# Patient Record
Sex: Female | Born: 1960
Health system: Southern US, Community
[De-identification: ages and names within clinical notes are randomized; demographics above are authoritative.]

## PROBLEM LIST (undated history)

## (undated) DIAGNOSIS — Z8481 Family history of carrier of genetic disease: Secondary | ICD-10-CM

## (undated) DIAGNOSIS — Z8042 Family history of malignant neoplasm of prostate: Secondary | ICD-10-CM

## (undated) DIAGNOSIS — Z803 Family history of malignant neoplasm of breast: Secondary | ICD-10-CM

## (undated) DIAGNOSIS — Z809 Family history of malignant neoplasm, unspecified: Secondary | ICD-10-CM

## (undated) DIAGNOSIS — D649 Anemia, unspecified: Secondary | ICD-10-CM

## (undated) DIAGNOSIS — D219 Benign neoplasm of connective and other soft tissue, unspecified: Secondary | ICD-10-CM

## (undated) HISTORY — DX: Family history of carrier of genetic disease: Z84.81

## (undated) HISTORY — DX: Anemia, unspecified: D64.9

## (undated) HISTORY — DX: Family history of malignant neoplasm of breast: Z80.3

## (undated) HISTORY — DX: Family history of malignant neoplasm, unspecified: Z80.9

## (undated) HISTORY — DX: Family history of malignant neoplasm of prostate: Z80.42

## (undated) HISTORY — DX: Benign neoplasm of connective and other soft tissue, unspecified: D21.9

---

## 1993-03-15 HISTORY — PX: TUBAL LIGATION: SHX77

## 2009-09-04 ENCOUNTER — Encounter: Admission: RE | Admit: 2009-09-04 | Discharge: 2009-09-04 | Payer: Self-pay

## 2010-02-27 ENCOUNTER — Ambulatory Visit: Payer: Self-pay | Admitting: Unknown Physician Specialty

## 2010-03-03 LAB — PATHOLOGY REPORT

## 2010-08-18 ENCOUNTER — Other Ambulatory Visit: Payer: Self-pay | Admitting: *Deleted

## 2010-08-18 DIAGNOSIS — Z1231 Encounter for screening mammogram for malignant neoplasm of breast: Secondary | ICD-10-CM

## 2010-08-27 ENCOUNTER — Ambulatory Visit
Admission: RE | Admit: 2010-08-27 | Discharge: 2010-08-27 | Disposition: A | Discharge status: Home / Self Care | Payer: BC Managed Care – PPO | Source: Ambulatory Visit | Attending: *Deleted | Admitting: *Deleted

## 2010-08-27 DIAGNOSIS — Z1231 Encounter for screening mammogram for malignant neoplasm of breast: Secondary | ICD-10-CM

## 2010-08-28 ENCOUNTER — Other Ambulatory Visit: Payer: Self-pay | Admitting: Family Medicine

## 2010-08-28 DIAGNOSIS — R928 Other abnormal and inconclusive findings on diagnostic imaging of breast: Secondary | ICD-10-CM

## 2010-09-03 ENCOUNTER — Ambulatory Visit
Admission: RE | Admit: 2010-09-03 | Discharge: 2010-09-03 | Disposition: A | Discharge status: Home / Self Care | Payer: BC Managed Care – PPO | Source: Ambulatory Visit | Attending: Family Medicine | Admitting: Family Medicine

## 2010-09-03 DIAGNOSIS — R928 Other abnormal and inconclusive findings on diagnostic imaging of breast: Secondary | ICD-10-CM

## 2011-04-13 ENCOUNTER — Other Ambulatory Visit: Payer: Self-pay | Admitting: Family Medicine

## 2011-04-13 DIAGNOSIS — Z1231 Encounter for screening mammogram for malignant neoplasm of breast: Secondary | ICD-10-CM

## 2011-06-10 ENCOUNTER — Ambulatory Visit
Admission: RE | Admit: 2011-06-10 | Discharge: 2011-06-10 | Disposition: A | Discharge status: Home / Self Care | Payer: BC Managed Care – PPO | Source: Ambulatory Visit | Attending: Family Medicine | Admitting: Family Medicine

## 2011-06-10 ENCOUNTER — Ambulatory Visit: Payer: BC Managed Care – PPO

## 2011-06-10 DIAGNOSIS — Z1231 Encounter for screening mammogram for malignant neoplasm of breast: Secondary | ICD-10-CM

## 2012-06-12 ENCOUNTER — Telehealth: Payer: Self-pay | Admitting: Obstetrics and Gynecology

## 2012-06-12 NOTE — Telephone Encounter (Signed)
LMTCB  aa 

## 2012-06-12 NOTE — Telephone Encounter (Signed)
Pt is having some menopausal concerns. Please call to advise.

## 2012-06-13 ENCOUNTER — Encounter: Payer: Self-pay | Admitting: Gynecology

## 2012-06-13 ENCOUNTER — Encounter: Payer: Self-pay | Admitting: Obstetrics and Gynecology

## 2012-06-13 ENCOUNTER — Ambulatory Visit (INDEPENDENT_AMBULATORY_CARE_PROVIDER_SITE_OTHER): Payer: 59 | Admitting: Gynecology

## 2012-06-13 VITALS — BP 130/68 | Resp 14 | Wt 164.0 lb

## 2012-06-13 DIAGNOSIS — N950 Postmenopausal bleeding: Secondary | ICD-10-CM

## 2012-06-13 NOTE — Telephone Encounter (Signed)
Spoke with pt who noticed some spotting on Friday, and continued spotting Sunday through today. Has not had a period for 2 yrs. Pt's PCP concerned. Sched OV today at 1245 with TL. aa

## 2012-06-13 NOTE — Addendum Note (Signed)
Addended by: Douglass Rivers on: 06/13/2012 02:04 PM   Modules accepted: Level of Service

## 2012-06-13 NOTE — Progress Notes (Signed)
  Endometrial Biopsy Procedure Note  Pt on HRT since August 2013, vivelle 0.1 and daily prometrium , started painless spotting a week ago, came in for evaluation, Pt overall very happy on HRT and if possible would like to continue.   Recommend EMB for evaluation, pt agreeable Pre-operative Diagnosis: postmenopausal bleeding on HRT  Post-operative Diagnosis: same  Indications: bleeding on postmenopausal hormone replacement  Procedure Details   Urine pregnancy test was not done.  The risks (including infection, bleeding, pain, and uterine perforation) and benefits of the procedure were explained to the patient and Verbal informed consent was obtained.  Antibiotic prophylaxis against endocarditis was not indicated.   The patient was placed in the dorsal lithotomy position.  Bimanual exam showed the uterus to be in the anteroflexed position.  A Graves' speculum inserted in the vagina, and the cervix prepped with povidone iodine. 2% Xylocaine jelly was placed on ecto and endocervix prior to betadine.   A sharp tenaculum was applied to the anterior lip of the cervix for stabilization.  A sterile Pipelle was used to sound the uterus to a depth of 7cm.  A Pipelle endometrial aspirator was used to sample the endometrium.  Sample was sent for pathologic examination.  Condition: Stable  Complications: None  Plan:  The patient was advised to call for any fever or for prolonged or severe pain or bleeding. She was advised to use OTC ibuprofen as needed for mild to moderate pain. She was advised to avoid vaginal intercourse for 48 hours or until the bleeding has completely stopped. Will  Notify regarding results and next step

## 2012-06-13 NOTE — Progress Notes (Deleted)
52 y.o. Unknown{Race/ethnicity:17218} female   [id number] here for annual exam.    No LMP recorded.          Sexually active: {yes no:314532}  The current method of family planning is {contraception:315051}.    Exercising: {yes no:314532}  {types:19826} Last mammogram: Last pap: Last BMD:  Alcohol: Tobacco:   Health Maintenance  Topic Date Due  . Influenza Vaccine  11/14/1959  . Pap Smear  02/05/1977  . Tetanus/tdap  02/05/1978  . Colonoscopy  02/05/2009  . Mammogram  06/09/2013    Family History  Problem Relation Age of Onset  . Ovarian cancer Sister     There is no problem list on file for this patient.   Past Medical History  Diagnosis Date  . Anemia     Past Surgical History  Procedure Laterality Date  . Tubal ligation  03/15/1993    Allergies: Naprosyn  Current Outpatient Prescriptions  Medication Sig Dispense Refill  . BLACK COHOSH PO Take by mouth.      . IRON PO Take by mouth.       No current facility-administered medications for this visit.    ROS: {Ros - complete:30496}  Exam:    There were no vitals taken for this visit. Weight change: @WEIGHTCHANGE @ Last 3 height recordings:  Ht Readings from Last 3 Encounters:  No data found for Ht   General appearance: {general exam:16600} Head: {head exam:30909::"Normocephalic, without obvious abnormality","atraumatic"} Neck: {neck exam:17463::"no adenopathy","no carotid bruit","no JVD","supple, symmetrical, trachea midline","thyroid not enlarged, symmetric, no tenderness/mass/nodules"} Lungs: {lung exam:16931} Breasts: {breast exam:13139::"normal appearance, no masses or tenderness"} Heart: {heart exam:5510} Abdomen: {abdominal exam:16834} Extremities: {extremity exam:5109} Skin: {skin exam:31329::"Skin color, texture, turgor normal. No rashes or lesions"} Lymph nodes: {lymph node exam:14039::"Cervical, supraclavicular, and axillary nodes normal."} {Exam; lymph nodes inguinal:30852} Neurologic: {neuro  exam:17854}   Pelvic: External genitalia:  {Exam; genitalia female:32129}              Urethra: {urethra:311719::"not indicated"}              Bartholins and Skenes: {EXAM; GYN AVWUJ:81191}                 Vagina: {vagina:315903::"normal appearing vagina with normal color and discharge, no lesions"}              Cervix: {exam; gyn cervix:30847}              Pap taken: {yes no:314532}        Bimanual Exam:  Uterus:  {uterus:315905::"uterus is normal size, shape, consistency and nontender"}                                      Adnexa:    {adnexa:311645::"not indicated"}                                      Rectovaginal: {Rectovaginal:16320}                                      Anus:  {Exam; anus:16940}  A: {Gyn assessment:5268::"well woman"}     P: {plan; gyn:5269::"mammogram","pap smear","return annually or prn"}      An After Visit Summary was printed and given to the patient.

## 2012-06-13 NOTE — Patient Instructions (Signed)
  Postmenopausal Bleeding Menopause is commonly referred to as the "change in life." It is a time when the fertile years, the time of ovulating and having menstrual periods, has come to an end. It is also determined by not having menstrual periods for 12 months.  Postmenopausal bleeding is any bleeding a woman has after she has entered into menopause. Any type of postmenopausal bleeding, even if it appears to be a typical menstrual period, is concerning. This should be evaluated by your caregiver.  CAUSES   Hormone therapy.  Cancer of the cervix or cancer of the lining of the uterus (endometrial cancer).  Thinning of the uterine lining (uterine atrophy).  Thyroid diseases.  Certain medicines.  Infection of the uterus or cervix.  Inflammation or irritation of the uterine lining (endometritis).  Estrogen-secreting tumors.  Growths (polyps) on the cervix, uterine lining, or uterus.  Uterine tumors (fibroids).  Being very overweight (obese). DIAGNOSIS  Your caregiver will take a medical history and ask questions. A physical exam will also be performed. Further tests may include:   A transvaginal ultrasound. An ultrasound wand or probe is inserted into your vagina to view the pelvic organs.  A biopsy of the lining of the uterus (endometrium). A sample of the endometrium is removed and examined.  A hysteroscopy. Your caregiver may use an instrument with a light and a camera attached to it (hysteroscope). The hysteroscope is used to look inside the uterus for problems.  A dilation and curettage (D&C). Tissue is removed from the uterine lining to be examined for problems. TREATMENT  Treatment depends on the cause of the bleeding. Some treatments include:   Surgery.  Medicines.  Hormones.  A hysteroscopy or D&C to remove polyps or fibroids.  Changing or stopping a current medicine you are taking. Talk to your caregiver about your specific treatment. HOME CARE INSTRUCTIONS    Maintain a healthy weight.  Keep regular pelvic exams and Pap tests. SEEK MEDICAL CARE IF:   You have bleeding, even if it is light in comparison to your previous periods.  Your bleeding lasts more than 1 week.  You have abdominal pain.  You develop bleeding with sexual intercourse. SEEK IMMEDIATE MEDICAL CARE IF:   You have a fever, chills, headache, dizziness, muscle aches, and bleeding.  You have severe pain with bleeding.  You are passing blood clots.  You have bleeding and need more than 1 pad an hour.  You feel faint. MAKE SURE YOU:  Understand these instructions.  Will watch your condition.  Will get help right away if you are not doing well or get worse. Document Released: 06/09/2005 Document Revised: 05/24/2011 Document Reviewed: 11/05/2010 [facility] Patient Information 2013 [place], [place]. Place endometrial biopsy patient instructions here.

## 2012-06-23 ENCOUNTER — Encounter: Payer: Self-pay | Admitting: *Deleted

## 2012-06-23 ENCOUNTER — Telehealth: Payer: Self-pay | Admitting: Gynecology

## 2012-06-23 NOTE — Telephone Encounter (Signed)
Informed pt regarding her recent EMB.  No atypia noted but scant cells therefore unable to evaluate for hyperplasia.  I have suggested that she return to office for sonohyterogram to rule out other pathology and possible repeat endometrial biopsy.  Pt reports stopping HRT after bleed, but she agrees to need for further evaluation.  She would like to discuss alternative therapies. Call transferred to [facility]

## 2012-06-23 NOTE — Telephone Encounter (Signed)
PATIENT CALLING FOR RESULTS OF ENDOMETRIAL BIOPSY DONE ON 06/13/2012. PLEASE ADVISE. Laura Dixon

## 2012-06-28 ENCOUNTER — Other Ambulatory Visit: Payer: Self-pay | Admitting: Gynecology

## 2012-06-28 ENCOUNTER — Other Ambulatory Visit: Payer: Self-pay | Admitting: *Deleted

## 2012-06-28 ENCOUNTER — Ambulatory Visit (INDEPENDENT_AMBULATORY_CARE_PROVIDER_SITE_OTHER): Payer: 59 | Admitting: Gynecology

## 2012-06-28 ENCOUNTER — Ambulatory Visit: Payer: 59

## 2012-06-28 ENCOUNTER — Encounter: Payer: Self-pay | Admitting: Gynecology

## 2012-06-28 DIAGNOSIS — N950 Postmenopausal bleeding: Secondary | ICD-10-CM

## 2012-06-28 DIAGNOSIS — D259 Leiomyoma of uterus, unspecified: Secondary | ICD-10-CM

## 2012-06-28 DIAGNOSIS — N951 Menopausal and female climacteric states: Secondary | ICD-10-CM

## 2012-06-28 MED ORDER — CONJ ESTROGENS-BAZEDOXIFENE 0.45-20 MG PO TABS: 0.4500 | ORAL_TABLET | ORAL | Status: DC

## 2012-06-28 NOTE — Progress Notes (Signed)
52 y.o.Unknownfemale here for a pelvic ultrasound with sonohystogram.  Indication: postmenopausal bleed with insufficient biopsy   Contraception: none  Technique:  Both transabdominal and transvaginal ultrasound  examinations of the pelvis were performed. Transabdominal technique  was performed for global imaging of the pelvis including uterus,  ovaries, adnexal regions, and pelvic cul-de-sac.  It was necessary to proceed with endovaginal exam following the abdominal ultrasound transabdominal exam to visualize the endometrium and adnexa.  Color and duplex Doppler ultrasound was utilized to evaluate blood  flow to the ovaries.       SHSG:  After obtaining appropriate verbal consent from patient, the cervix was visualized using a speculum, and prepped with betadine.  A tenaculum  was applied to the cervix.  Dilation of the cervix was necessary. The catheter was passed into the uterus and sterile saline introduced, with the following findings:  The endometrial cavity was notable for a submucosal fibroid, the lining was thin, no defects, EMS 2mm. The ultrasound and sonohysterogram findings were reviewed with the patient.  Based on the lining a repeat EMB was not done.  The patient had electively stopped HRT after the biopsy, she is now experiencing more hot flashes and night sweats that are disruptive to her activities of daily living.   The patient would like to discuss alternative treatments for her menopausal symptoms.  We reviewed her family history-14 children, no cardiac disease, 2 cancers- sister dx with ovarian alive and well after 20y We reviewed the risks and benefits of hormone replacement, including the findings of the [facility] and nurses study.  We discussed non-hormonal treatments as well as hormonal options.  After reviewing options extensively, the pt would like to try [facility], if it is too expensive, she will call for an rx  for effexor.   Questions were addressed. time >26m, >50%  face to face

## 2012-08-17 ENCOUNTER — Telehealth: Payer: Self-pay | Admitting: Gynecology

## 2012-08-17 NOTE — Telephone Encounter (Signed)
FYI only--Patient states Duavee and wasn't working. Is now back on patch Minivelle and progesterone tablet and reports she is doing fine with it in her second week of starting back on it.

## 2012-09-11 ENCOUNTER — Encounter: Payer: Self-pay | Admitting: Obstetrics and Gynecology

## 2012-09-11 ENCOUNTER — Ambulatory Visit: Payer: Self-pay | Admitting: Obstetrics and Gynecology

## 2012-09-11 ENCOUNTER — Ambulatory Visit (INDEPENDENT_AMBULATORY_CARE_PROVIDER_SITE_OTHER): Payer: 59 | Admitting: Obstetrics and Gynecology

## 2012-09-11 ENCOUNTER — Other Ambulatory Visit: Payer: Self-pay | Admitting: Obstetrics and Gynecology

## 2012-09-11 VITALS — BP 128/74 | HR 82 | Resp 14 | Ht 66.0 in | Wt 167.8 lb

## 2012-09-11 DIAGNOSIS — Z01419 Encounter for gynecological examination (general) (routine) without abnormal findings: Secondary | ICD-10-CM

## 2012-09-11 DIAGNOSIS — Z0000 Encounter for general adult medical examination without abnormal findings: Secondary | ICD-10-CM

## 2012-09-11 LAB — POCT URINALYSIS DIPSTICK: Spec Grav, UA: 1.015

## 2012-09-11 MED ORDER — PROGESTERONE MICRONIZED 100 MG PO CAPS
100.0000 mg | ORAL_CAPSULE | Freq: Every day | ORAL | Status: DC
Start: 2012-09-11 — End: 2013-05-08

## 2012-09-11 MED ORDER — ESTRADIOL 0.1 MG/24HR TD PTTW: 1.0000 | MEDICATED_PATCH | TRANSDERMAL | Status: DC

## 2012-09-11 NOTE — Progress Notes (Signed)
52 y.o.  Married    [place]   female   [phone]   here for annual exam.  Loves her HRT. No more hot flashes.  Tried to stay off it for a few days but hot flashes quickly returned.   Tried Duavee but didn't like it.  Pt thinks she may have sometimes taken her progesterone twice at night just because she would forget if she took it or not. Had spotting, saw Dr. Farrel Gobble, had PUS and SHSG which showed only small fibroids but a 2 mm EMS.    Now back on her HRT like she's supposed to take it for the last 3 weeks and no spotting.    Patient's last menstrual period was 07/14/2010.          Sexually active: yes  The current method of family planning is tubal ligation.    Exercising:  golfing Last mammogram:  05/2011 Last pap smear: 06/2011 at PCP - nl History of abnormal pap: no Smoking: no Alcohol: no Last colonoscopy: 01/2011 Last Bone Density:   Last tetanus shot: 2013 Last cholesterol check: 2013  Hgb: 12.0               Urine: negative   Family History  Problem Relation Age of Onset  . Ovarian cancer Sister   . Prostate cancer Brother 85    Patient Active Problem List   Diagnosis Date Noted  . Menopausal symptoms 06/28/2012  . Postmenopausal bleeding 06/13/2012    Past Medical History  Diagnosis Date  . Anemia   . Fibroid     Past Surgical History  Procedure Laterality Date  . Tubal ligation  03/15/1993    Allergies: Naprosyn  Current Outpatient Prescriptions  Medication Sig Dispense Refill  . estradiol (VIVELLE-DOT) 0.1 MG/24HR Place 1 patch onto the skin 2 (two) times a week.      . progesterone (PROMETRIUM) 100 MG capsule Take 100 mg by mouth daily.       No current facility-administered medications for this visit.    ROS: Pertinent items are noted in HPI.  Social Hx:  Married, two children, works as a Production designer, theatre/television/film  Exam:    BP 128/74  Pulse 82  Resp 14  Ht 5\' 6"  (1.676 m)  Wt 167 lb 12.8 oz (76.114 kg)  BMI 27.1 kg/m2  LMP 07/14/2010  Down 7 pounds from  last year; ht stable Wt Readings from Last 3 Encounters:  09/11/12 167 lb 12.8 oz (76.114 kg)  06/13/12 164 lb (74.39 kg)     Ht Readings from Last 3 Encounters:  09/11/12 5\' 6"  (1.676 m)    General appearance: alert, cooperative and appears stated age Head: Normocephalic, without obvious abnormality, atraumatic Neck: no adenopathy, supple, symmetrical, trachea midline and thyroid not enlarged, symmetric, no tenderness/mass/nodules Lungs: clear to auscultation bilaterally Breasts: Inspection negative, No nipple retraction or dimpling, No nipple discharge or bleeding, No axillary or supraclavicular adenopathy, Normal to palpation without dominant masses Heart: regular rate and rhythm Abdomen: soft, non-tender; bowel sounds normal; no masses,  no organomegaly Extremities: extremities normal, atraumatic, no cyanosis or edema Skin: Skin color, texture, turgor normal. No rashes or lesions Lymph nodes: Cervical, supraclavicular, and axillary nodes normal. No abnormal inguinal nodes palpated Neurologic: Grossly normal   Pelvic: External genitalia:  no lesions              Urethra:  normal appearing urethra with no masses, tenderness or lesions  Bartholins and Skenes: normal                 Vagina: normal appearing vagina with normal color and discharge, no lesions              Cervix: normal appearance              Pap taken: no        Bimanual Exam:  Uterus:  uterus is normal size, shape, consistency and nontender                                      Adnexa: normal adnexa in size, nontender and no masses                                      Rectovaginal: Confirms                                      Anus:  normal sphincter tone, no lesions  A: normal menopausal exam, on HRT     P: mammogram counseled on adequate intake of calcium and vitamin D, diet and exercise return annually or prn     An After Visit Summary was printed and given to the patient.

## 2012-09-11 NOTE — Patient Instructions (Addendum)

## 2012-09-20 ENCOUNTER — Telehealth: Payer: Self-pay | Admitting: Gynecology

## 2012-09-20 NOTE — Telephone Encounter (Signed)
Patient rescheduled 3 month appointment from 09/27/12 to 09/29/12 with Dr. Farrel Gobble due to a surgery conflict Dr. Farrel Gobble had. Patient just saw Dr. Tresa Res for aex 09/11/12 and is not sure she still needs 3 month recheck. Please advise if appointment not needed?

## 2012-09-21 NOTE — Telephone Encounter (Signed)
No need to keep the f/u appt

## 2012-09-27 ENCOUNTER — Ambulatory Visit: Payer: 59 | Admitting: Gynecology

## 2012-09-29 ENCOUNTER — Ambulatory Visit: Payer: 59 | Admitting: Gynecology

## 2012-12-26 ENCOUNTER — Encounter: Payer: Self-pay | Admitting: Gynecology

## 2013-01-18 ENCOUNTER — Other Ambulatory Visit: Payer: Self-pay

## 2013-02-05 ENCOUNTER — Ambulatory Visit: Payer: Self-pay

## 2013-02-05 LAB — URINALYSIS, COMPLETE
Bilirubin,UR: NEGATIVE
Glucose,UR: NEGATIVE mg/dL (ref 0–75)
Ketone: NEGATIVE
Leukocyte Esterase: NEGATIVE
Nitrite: NEGATIVE
Ph: 6 (ref 4.5–8.0)
Specific Gravity: >=1.03 (ref 1.003–1.030)

## 2013-02-06 ENCOUNTER — Telehealth: Payer: Self-pay | Admitting: Gynecology

## 2013-02-06 NOTE — Telephone Encounter (Signed)
Spoke with patient. She states she is feeling improved. Advised to continue medications as prescribed. Call us if develops, fever, flank pain or chills or vomiting.  TOC appointment scheduled.   Routing to provider for final review. Patient agreeable to disposition. Will close encounter

## 2013-02-06 NOTE — Telephone Encounter (Signed)
Patient went to see urgent care yesterday for bladder infection and they gave her these meds she wanted to make sure its ok.  nitrofurantroin  Phenazopyridine fluconazole  They told here that there was blood in her urine and that She also needed to schedule a follow up appt. For 2 weeks.

## 2013-02-07 LAB — URINE CULTURE

## 2013-02-19 ENCOUNTER — Ambulatory Visit (INDEPENDENT_AMBULATORY_CARE_PROVIDER_SITE_OTHER): Payer: 59 | Admitting: Gynecology

## 2013-02-19 ENCOUNTER — Other Ambulatory Visit: Payer: Self-pay

## 2013-02-19 VITALS — BP 118/78 | HR 62 | Resp 12 | Ht 66.0 in | Wt 171.0 lb

## 2013-02-19 DIAGNOSIS — M545 Low back pain, unspecified: Secondary | ICD-10-CM

## 2013-02-19 DIAGNOSIS — R319 Hematuria, unspecified: Secondary | ICD-10-CM

## 2013-02-19 LAB — POCT URINALYSIS DIPSTICK: Blood, UA: 2

## 2013-02-19 MED ORDER — CARISOPRODOL 350 MG PO TABS: 350.0000 mg | ORAL_TABLET | Freq: Four times a day (QID) | ORAL | Status: DC | PRN

## 2013-02-19 NOTE — Progress Notes (Signed)
Subjective:     Patient ID: Laura Dixon, female   DOB: 1960/05/20, 52 y.o.   MRN: [id number]  HPI Comments: Pt went to [facility] urgent care 2w ago for urinary frequency as precaution before Thanksgiving.  Pt was noted to have blood in urine but denies any pressure, burning or other symptoms.  Pt was treated with macrobid and pyridium.  Pt is using progestin and estrogen patch- pt had not missed any pills and is unaware of vaginal bleeding.  Pt dose drink 1-2 soda/d, no coffee or tea.   Pt also reports low back pain not related to the urine, initial injury approx 10y ago. Had used motrin with little affect    Review of Systems  Constitutional: Negative for fever and chills.  Genitourinary: Positive for frequency and hematuria. Negative for urgency, vaginal bleeding, vaginal discharge and vaginal pain.       Objective:   Physical Exam  Constitutional: She is oriented to person, place, and time. She appears well-developed and well-nourished.  Abdominal: There is no CVA tenderness.  Genitourinary: Vagina normal. There is no rash or tenderness on the right labia. There is no rash or tenderness on the left labia. Uterus is enlarged. Uterus is not tender. Cervix exhibits no motion tenderness and no discharge. Right adnexum displays no mass. Left adnexum displays no mass.  No tenderness under bladder  Lymphadenopathy:       Right: No inguinal adenopathy present.       Left: No inguinal adenopathy present.  Neurological: She is alert and oriented to person, place, and time.       Assessment:     hemturia-asymptomatic Low back pain     Plan:     Urine culture-if negative, will refer to urology-differential discussed with pt.  Since no symptoms, no rx given. rx soma TID prn back pain-driving precautions given

## 2013-02-20 LAB — URINALYSIS, MICROSCOPIC ONLY
Bacteria, UA: NONE SEEN
Casts: NONE SEEN

## 2013-02-21 LAB — URINE CULTURE: Colony Count: >=100000

## 2013-02-23 ENCOUNTER — Telehealth: Payer: Self-pay | Admitting: Gynecology

## 2013-02-23 MED ORDER — FLUCONAZOLE 150 MG PO TABS: 150.0000 mg | ORAL_TABLET | Freq: Once | ORAL | Status: DC

## 2013-02-23 MED ORDER — CEPHALEXIN 500 MG PO CAPS: 500.0000 mg | ORAL_CAPSULE | Freq: Four times a day (QID) | ORAL | Status: DC

## 2013-02-23 NOTE — Addendum Note (Signed)
Addended by: Lorraine Lax on: 02/23/2013 02:49 PM   Modules accepted: Orders

## 2013-02-23 NOTE — Telephone Encounter (Signed)
Pt is currently on Cephalexin 500 mg 1 pill 4 times a day. Pt is questioning if she is suppose to take that much each day.

## 2013-02-26 ENCOUNTER — Other Ambulatory Visit: Payer: Self-pay

## 2013-02-26 DIAGNOSIS — Z1231 Encounter for screening mammogram for malignant neoplasm of breast: Secondary | ICD-10-CM

## 2013-02-26 NOTE — Telephone Encounter (Signed)
Order per Dr. Farrel Gobble:  Notes Recorded by Bennye Alm, MD on 02/23/2013 at 11:53 AM Inform that the urine culture was lactobacilli with is a vaginal bateria and not considered a pathogen but if she is having symptoms, i would try keflex 500.g QID #28 and do a test of cure   Message left to return call to [place] at [phone].

## 2013-02-26 NOTE — Telephone Encounter (Signed)
Spoke with patient and went over instructions for taking her medication.   States she feels improved but that she is sometimes noticing spotting after urinating since Saturday. Dr. Farrel Gobble, do you need to see patient again since she is having spotting? She states she will call back if continues? Has appointment for [facility] 03/20/13.

## 2013-02-26 NOTE — Telephone Encounter (Signed)
The patient was unable to tell me where she thought she may be bleeding from. Her appointment is an OV with Dr. Farrel Gobble at 03/30/13 at 2:00 patient just rescheduled appointment with front desk.

## 2013-02-26 NOTE — Telephone Encounter (Signed)
Is the bleeding vaginal or urine, she will need ov with toc and not just lab visit

## 2013-02-26 NOTE — Telephone Encounter (Signed)
Spoke with patient. She feels she has something respiratory related. Was around family who may have been sick this weekend. She wanted to know if she should finish abx as directed by Dr. Farrel Gobble. I advised, yes continue until done. These antibiotics will usually not cover anything respiratory so if worsens, please call her pcp. Patient is agreeable and will monitor symptoms.

## 2013-02-26 NOTE — Telephone Encounter (Signed)
Patient states she isnt feeling well and wasn't sure if she need to continue the antibiotics dr lathrop gave her. She said she was gonna go see her pcp if she didn't feel better tomorrow

## 2013-03-30 ENCOUNTER — Ambulatory Visit: Payer: 59 | Admitting: Gynecology

## 2013-03-30 ENCOUNTER — Ambulatory Visit: Payer: BC Managed Care – PPO

## 2013-04-10 ENCOUNTER — Ambulatory Visit
Admission: RE | Admit: 2013-04-10 | Discharge: 2013-04-10 | Disposition: A | Discharge status: Home / Self Care | Payer: 59 | Source: Ambulatory Visit

## 2013-04-10 ENCOUNTER — Ambulatory Visit (INDEPENDENT_AMBULATORY_CARE_PROVIDER_SITE_OTHER): Payer: 59 | Admitting: Gynecology

## 2013-04-10 ENCOUNTER — Other Ambulatory Visit: Payer: Self-pay

## 2013-04-10 ENCOUNTER — Encounter: Payer: Self-pay | Admitting: Gynecology

## 2013-04-10 VITALS — BP 126/78 | HR 80 | Resp 18 | Ht 66.0 in | Wt 172.0 lb

## 2013-04-10 DIAGNOSIS — Z1231 Encounter for screening mammogram for malignant neoplasm of breast: Secondary | ICD-10-CM

## 2013-04-10 DIAGNOSIS — N390 Urinary tract infection, site not specified: Secondary | ICD-10-CM

## 2013-04-10 DIAGNOSIS — M545 Low back pain, unspecified: Secondary | ICD-10-CM

## 2013-04-10 DIAGNOSIS — D219 Benign neoplasm of connective and other soft tissue, unspecified: Secondary | ICD-10-CM

## 2013-04-10 DIAGNOSIS — D259 Leiomyoma of uterus, unspecified: Secondary | ICD-10-CM

## 2013-04-10 MED ORDER — CARISOPRODOL 350 MG PO TABS: 350.0000 mg | ORAL_TABLET | Freq: Four times a day (QID) | ORAL | Status: DC | PRN

## 2013-04-10 NOTE — Progress Notes (Signed)
Subjective:     Patient ID: Laura Dixon, female   DOB: 12-12-1960, 53 y.o.   MRN: [id number]  HPI Comments: Pt is here for test of cure for UTI with lactobacilli, she states that she is feeling better regarding her urgency.  She does state that she did not end up picking up her soma as it was not ready when she went to the pharmacy instead she had been taking NSAIDs but has not had great results.  Pt reports pain in her right hip, behind her right knee and to she right shin.  She denies change in either exercise or shoes.  She is unaware if she has any swelling but she states that she feels like she gets compression support from her boots.  Pt is taking both estrogen and progestin.  No prior history of blood clots.    Review of Systems Per HPI    Objective:   Physical Exam  Constitutional: She is oriented to person, place, and time. She appears well-developed and well-nourished.  Musculoskeletal:       Right lower leg: She exhibits no tenderness, no bony tenderness, no swelling and no edema (calf measured 80mm below top of knee, 85mm, neg homan's sign).       Left lower leg: She exhibits no tenderness, no bony tenderness (measured 15cm below top of knee, 35cm), no swelling and no edema.       Legs: Neurological: She is alert and oriented to person, place, and time.  Skin: Skin is warm and dry.       Assessment:     Right hip/leg pain on estrogen Fibroid uterus     Plan:     No evidence of DVT, we reviewed her u/s images, posterior fibroid noted 2.5cm but not pedunculated, may be etiology of pain but expect more musculoskeletal, we refilled her soma and we ask her to f/u with pcp and let us know how she is doing, may benefit from PT

## 2013-04-11 LAB — URINE CULTURE
Colony Count: NO GROWTH
ORGANISM ID, BACTERIA: NO GROWTH

## 2013-04-16 ENCOUNTER — Ambulatory Visit: Payer: BC Managed Care – PPO

## 2013-05-08 ENCOUNTER — Other Ambulatory Visit: Payer: Self-pay | Admitting: *Deleted

## 2013-05-08 MED ORDER — ESTRADIOL 0.1 MG/24HR TD PTTW: 1.0000 | MEDICATED_PATCH | TRANSDERMAL | Status: DC

## 2013-05-08 MED ORDER — PROGESTERONE MICRONIZED 100 MG PO CAPS: 100.0000 mg | ORAL_CAPSULE | Freq: Every day | ORAL | Status: DC

## 2013-05-08 NOTE — Telephone Encounter (Signed)
Incoming fax requesting [place] and Progesterone. Last AEX and refill 09/11/2012 x 1 year, sent to [facility] Next appt 09/21/2013  - Called patient and she is going to start using [facility].  - Rx sent enough until 09/2013 - Pt notified.

## 2013-09-20 ENCOUNTER — Ambulatory Visit: Payer: 59 | Admitting: Gynecology

## 2013-09-21 ENCOUNTER — Ambulatory Visit: Payer: 59 | Admitting: Gynecology

## 2013-10-01 ENCOUNTER — Other Ambulatory Visit: Payer: Self-pay

## 2013-10-01 NOTE — Telephone Encounter (Signed)
Last AEX: 09/11/12 Last refill:05/08/13 #24, 1 refill Last MMG: 04/13/13 BI-RADS-neg Current AEX: pt has canceled 2 AEX appt.  Please approve or deny

## 2013-11-23 ENCOUNTER — Other Ambulatory Visit: Payer: Self-pay

## 2013-11-23 NOTE — Telephone Encounter (Signed)
Last AEX: 09/11/12 Last refill: 05/08/13 #90 X 1 Current AEX: NS Last MMG: 04/13/13 Bi-Rads neg  Please advise

## 2013-11-27 MED ORDER — PROGESTERONE MICRONIZED 100 MG PO CAPS: 100.0000 mg | ORAL_CAPSULE | Freq: Every day | ORAL | Status: DC

## 2013-11-27 NOTE — Addendum Note (Signed)
Addended by: Gerda Diss on: 11/27/2013 02:02 PM   Modules accepted: Orders

## 2013-11-27 NOTE — Telephone Encounter (Signed)
Sent to wrong pharmacy.  Resent rx to [facility]

## 2013-11-27 NOTE — Telephone Encounter (Signed)
Overdue for annual

## 2013-11-27 NOTE — Telephone Encounter (Signed)
S/w pt. She will call back tomorrow to schedule her AEX due to her driving. Encounter closed

## 2013-11-29 MED ORDER — PROGESTERONE MICRONIZED 100 MG PO CAPS: 100.0000 mg | ORAL_CAPSULE | Freq: Every day | ORAL | Status: DC

## 2013-11-29 MED ORDER — ESTRADIOL 0.1 MG/24HR TD PTTW: 1.0000 | MEDICATED_PATCH | TRANSDERMAL | Status: DC

## 2013-11-29 NOTE — Addendum Note (Signed)
Addended by: Gerda Diss on: 11/29/2013 08:53 AM   Modules accepted: Orders

## 2013-11-29 NOTE — Telephone Encounter (Signed)
S/W pt and she stated the pharmacy had not received her rx. Resent to pharmacy  Sent 30 day supply until pt has her AEX

## 2013-12-12 ENCOUNTER — Ambulatory Visit: Payer: 59 | Admitting: Gynecology

## 2013-12-12 ENCOUNTER — Encounter: Payer: Self-pay | Admitting: Gynecology

## 2013-12-12 ENCOUNTER — Ambulatory Visit (INDEPENDENT_AMBULATORY_CARE_PROVIDER_SITE_OTHER): Payer: BC Managed Care – PPO | Admitting: Gynecology

## 2013-12-12 VITALS — BP 120/60 | HR 70 | Resp 16 | Ht 66.0 in | Wt 166.0 lb

## 2013-12-12 DIAGNOSIS — Z124 Encounter for screening for malignant neoplasm of cervix: Secondary | ICD-10-CM

## 2013-12-12 DIAGNOSIS — Z01419 Encounter for gynecological examination (general) (routine) without abnormal findings: Secondary | ICD-10-CM

## 2013-12-12 DIAGNOSIS — N951 Menopausal and female climacteric states: Secondary | ICD-10-CM

## 2013-12-12 DIAGNOSIS — Z0000 Encounter for general adult medical examination without abnormal findings: Secondary | ICD-10-CM

## 2013-12-12 LAB — POCT URINALYSIS DIPSTICK
LEUKOCYTES UA: NEGATIVE
PH UA: 5
Urobilinogen, UA: NEGATIVE

## 2013-12-12 LAB — HEMOGLOBIN, FINGERSTICK: Hemoglobin, fingerstick: 12.4 g/dL (ref 12.0–16.0)

## 2013-12-12 MED ORDER — PROGESTERONE MICRONIZED 100 MG PO CAPS: 100.0000 mg | ORAL_CAPSULE | Freq: Every day | ORAL | Status: DC

## 2013-12-12 MED ORDER — ESTRADIOL 0.1 MG/24HR TD PTTW: 1.0000 | MEDICATED_PATCH | TRANSDERMAL | Status: DC

## 2013-12-12 NOTE — Progress Notes (Addendum)
53 y.o. Married [place] American female   [phone] here for annual exam. Pt reports menses are absent due to Menopause. She does not report hot flashes, does not have night sweats, does not have vaginal dryness.  She is not using lubricants.  She does report post-menopasual bleeding.  Very happy with HRT regiment, no bleeding  Patient's last menstrual period was 07/14/2010.          Sexually active: Yes.    The current method of family planning is post menopausal status.    Exercising: No.  The patient does not participate in regular exercise at present. Last pap: 06/2011- WNL Abnormal PAP: no Mammogram: 04/13/13 Bi-Rads 1 BSE: no  Colonoscopy:  2011- Normal f/u in 10 years  DEXA: never had one Alcohol: no Tobacco: no  Hgb: 12.4  ;  Urine: Trace    Health Maintenance  Topic Date Due  . Colonoscopy  02/05/2009  . Influenza Vaccine  10/13/2013  . Mammogram  04/11/2015  . Pap Smear  09/12/2015  . Tetanus/tdap  03/15/2021    Family History  Problem Relation Age of Onset  . Ovarian cancer Sister   . Prostate cancer Brother 69    Patient Active Problem List   Diagnosis Date Noted  . Menopausal symptoms 06/28/2012  . Postmenopausal bleeding 06/13/2012    Past Medical History  Diagnosis Date  . Anemia   . Fibroid     Past Surgical History  Procedure Laterality Date  . Tubal ligation  03/15/1993    Allergies: Naprosyn  Current Outpatient Prescriptions  Medication Sig Dispense Refill  . carisoprodol (SOMA) 350 MG tablet Take 1 tablet (350 mg total) by mouth 4 (four) times daily as needed for muscle spasms.  30 tablet  0  . estradiol (VIVELLE-DOT) 0.1 MG/24HR patch Place 1 patch (0.1 mg total) onto the skin 2 (two) times a week.  8 patch  0  . meloxicam (MOBIC) 15 MG tablet       . progesterone (PROMETRIUM) 100 MG capsule Take 1 capsule (100 mg total) by mouth daily.  30 capsule  0   No current facility-administered medications for this visit.    ROS: Pertinent items  are noted in HPI.  Exam:    LMP 07/14/2010 Weight change: @WEIGHTCHANGE @ Last 3 height recordings:  Ht Readings from Last 3 Encounters:  04/10/13 5\' 6"  (1.676 m)  02/19/13 5\' 6"  (1.676 m)  09/11/12 5\' 6"  (1.676 m)   General appearance: alert, cooperative and appears stated age Head: Normocephalic, without obvious abnormality, atraumatic Neck: no adenopathy, no carotid bruit, no JVD, supple, symmetrical, trachea midline and thyroid not enlarged, symmetric, no tenderness/mass/nodules Lungs: clear to auscultation bilaterally Breasts: Inspection negative, No nipple retraction or dimpling, No nipple discharge or bleeding, No axillary or supraclavicular adenopathy, Normal to palpation without dominant masses Heart: regular rate and rhythm, S1, S2 normal, no murmur, click, rub or gallop Abdomen: soft, non-tender; bowel sounds normal; no masses,  no organomegaly Extremities: extremities normal, atraumatic, no cyanosis or edema Skin: Skin color, texture, turgor normal. No rashes or lesions Lymph nodes: Cervical, supraclavicular, and axillary nodes normal. no inguinal nodes palpated Neurologic: Grossly normal   Pelvic: External genitalia:  no lesions              Urethra: normal appearing urethra with no masses, tenderness or lesions              Bartholins and Skenes: Bartholin's, Urethra, Skene's normal  Vagina: normal appearing vagina with normal color and discharge, no lesions              Cervix: normal appearance              Pap taken: Yes.          Bimanual Exam:  Uterus:  uterus is normal size, shape, consistency and nontender                                      Adnexa:    normal adnexa in size, nontender and no masses                                      Rectovaginal: Confirms                                      Anus:  normal sphincter tone, no lesions         1. Routine gynecological examination  mammogram pap smear done, no record of 2013 PAP-pt  agreeabe counseled on breast self exam, mammography screening, menopause, adequate intake of calcium and vitamin D, diet and exercise return annually or prn Discussed PAP guideline changes, importance of weight bearing exercises, calcium, vit D and balanced diet. 2. Laboratory examination ordered as part of a routine general medical examination  - POCT Urinalysis Dipstick - Hemoglobin, fingerstick  3. Menopausal symptom Doing well on current regimen, wishes to continue, pt will need 2nd rx sent to mail order-does not have info today - progesterone (PROMETRIUM) 100 MG capsule; Take 1 capsule (100 mg total) by mouth daily.  Dispense: 30 capsule; Refill: 0 - estradiol (VIVELLE-DOT) 0.1 MG/24HR patch; Place 1 patch (0.1 mg total) onto the skin 2 (two) times a week.  Dispense: 8 patch; Refill: 0  An After Visit Summary was printed and given to the patient.

## 2013-12-14 ENCOUNTER — Telehealth: Payer: Self-pay | Admitting: Gynecology

## 2013-12-14 NOTE — Addendum Note (Signed)
Addended by: Elveria Rising on: 12/14/2013 04:13 PM   Modules accepted: Orders

## 2013-12-14 NOTE — Telephone Encounter (Signed)
Left message to call Kaitlyn at [phone]. 

## 2013-12-14 NOTE — Telephone Encounter (Signed)
Pt called during lunch stating that she wanted to share something with the nurse.

## 2013-12-18 LAB — IPS PAP TEST WITH HPV

## 2013-12-18 NOTE — Telephone Encounter (Signed)
Left message to call Kaitlyn at [phone]. 

## 2013-12-21 NOTE — Telephone Encounter (Signed)
Spoke with patient. Patient states that she was seen at urgent care and had x-rays done and was placed on a steroid. Patient states that she was having "symptoms" while on the steroid but spoke with urgent care and everything has cleared up. "i got it taken care of."  Advised patient if she needs anything to give [place] a call. Patient is agreeable.  Routing to provider for final review. Patient agreeable to disposition. Will close encounter

## 2013-12-24 ENCOUNTER — Ambulatory Visit: Payer: 59 | Admitting: Gynecology

## 2013-12-25 ENCOUNTER — Ambulatory Visit: Payer: 59 | Admitting: Gynecology

## 2014-01-14 ENCOUNTER — Encounter: Payer: Self-pay | Admitting: Gynecology

## 2014-01-28 ENCOUNTER — Other Ambulatory Visit: Payer: Self-pay

## 2014-01-28 DIAGNOSIS — N951 Menopausal and female climacteric states: Secondary | ICD-10-CM

## 2014-01-28 NOTE — Telephone Encounter (Addendum)
Incoming Refill Request from [facility] VY:[id number] 0.1 mg, progesterone 100mg  Last AEX:12/12/13 Last Refill:12/12/13 #8 X 0 Next AEX:NS Last MMG:04/10/13 Bi-Rads Neg   Per 12/12/13 Office visit note, pt wants refills to go to a home delivery. Pt was suppose to let us know the name.   Please Advise

## 2014-01-28 NOTE — Telephone Encounter (Signed)
LMTCB concerning refill request.

## 2014-01-29 ENCOUNTER — Other Ambulatory Visit: Payer: Self-pay | Admitting: Gynecology

## 2014-01-29 NOTE — Telephone Encounter (Signed)
Patient left a message 01/28/14 @ 5:40pm on answering machine returning a call to [facility]. (refill)

## 2014-01-29 NOTE — Telephone Encounter (Signed)
S/W pt. Pt want her rx to still go to local pharmacy. Stated to pt will refill until her MMG is due. Pt voiced understanding and agreed.   Ok to send this?

## 2014-01-30 MED ORDER — ESTRADIOL 0.1 MG/24HR TD PTTW: 1.0000 | MEDICATED_PATCH | TRANSDERMAL | Status: DC

## 2014-01-30 MED ORDER — PROGESTERONE MICRONIZED 100 MG PO CAPS: 100.0000 mg | ORAL_CAPSULE | Freq: Every day | ORAL | Status: DC

## 2014-01-30 NOTE — Telephone Encounter (Signed)
Laura Dixon sent 1 mth supply. LMOM on pt phone rx has been sent

## 2014-01-30 NOTE — Telephone Encounter (Addendum)
Pt checking on refill request. Please call her at work [phone].

## 2014-01-30 NOTE — Telephone Encounter (Signed)
LMOM informing rx has been sent

## 2014-02-04 ENCOUNTER — Telehealth: Payer: Self-pay | Admitting: Gynecology

## 2014-02-04 NOTE — Telephone Encounter (Signed)
Patient is asking to talk to [place] regarding her bill.

## 2014-02-27 ENCOUNTER — Other Ambulatory Visit: Payer: Self-pay | Admitting: Obstetrics & Gynecology

## 2014-02-27 DIAGNOSIS — N951 Menopausal and female climacteric states: Secondary | ICD-10-CM

## 2014-02-27 MED ORDER — PROGESTERONE MICRONIZED 100 MG PO CAPS: 100.0000 mg | ORAL_CAPSULE | Freq: Every day | ORAL | Status: DC

## 2014-02-27 NOTE — Telephone Encounter (Signed)
Rx done as well as Prometrium 100mg  daily as she will need this as well and it was written without additional refills.

## 2014-02-27 NOTE — Telephone Encounter (Signed)
Medication refill request: Vivelle Dot patch Last AEX: 12/12/13 Next AEX: not scheduled.  Last MMG (if hormonal medication request): 04/13/13 BIRADS1:neg Refill authorized: 01/30/14 #8patch/0 refills

## 2014-02-28 ENCOUNTER — Telehealth: Payer: Self-pay | Admitting: Gynecology

## 2014-02-28 NOTE — Telephone Encounter (Signed)
Patient requesting RX refills for: progesterone and vivelle dot be sent to her pharmacy on file in one month increments.

## 2014-02-28 NOTE — Telephone Encounter (Signed)
02/27/14 Vivelle Dot 0.1 mg #8/10 rfs and Progesterone 100 mg #30/10 rfs was sent to [facility], LM on patient's vm that rx has been sent.

## 2014-02-28 NOTE — Telephone Encounter (Signed)
Routed to provider for review, encounter closed.  

## 2014-03-12 ENCOUNTER — Telehealth: Payer: Self-pay | Admitting: Gynecology

## 2014-03-12 NOTE — Telephone Encounter (Signed)
Patient has a question regarding her recent statement.

## 2014-08-05 ENCOUNTER — Other Ambulatory Visit: Payer: Self-pay | Admitting: *Deleted

## 2014-08-05 DIAGNOSIS — N951 Menopausal and female climacteric states: Secondary | ICD-10-CM

## 2014-08-05 NOTE — Telephone Encounter (Signed)
Medication refill request from [facility] requesting 90 day supply of Progesterone 100 mg  Last AEX:  12/12/13 with TL Next AEX: No AEX scheduled for 2016 Last MMG (if hormonal medication request): 04/13/13 bi-rads 1: negative Refill authorized: Please advise.

## 2014-08-06 NOTE — Telephone Encounter (Signed)
I will RF for #30 days at a local pharmacy but please inform pt she needs MMG before any additional HRT refills will be done.  Also, she does need AEX scheduled.  RF declined for now.

## 2014-08-06 NOTE — Telephone Encounter (Signed)
Left Message To Call Back  

## 2014-08-09 ENCOUNTER — Other Ambulatory Visit: Payer: Self-pay

## 2014-08-09 DIAGNOSIS — Z1231 Encounter for screening mammogram for malignant neoplasm of breast: Secondary | ICD-10-CM

## 2014-08-09 NOTE — Telephone Encounter (Signed)
Called patient she is aware that she needs to schedule mmg before any further refills. Patient says she will schedule her mmg within the next couple of weeks. Patient will give [place] a call when this is done and to schedule AEX with Dr. Sabra Heck.

## 2014-08-16 ENCOUNTER — Other Ambulatory Visit: Payer: Self-pay

## 2014-08-16 ENCOUNTER — Telehealth: Payer: Self-pay | Admitting: Obstetrics & Gynecology

## 2014-08-16 ENCOUNTER — Ambulatory Visit
Admission: RE | Admit: 2014-08-16 | Discharge: 2014-08-16 | Disposition: A | Discharge status: Home / Self Care | Payer: BLUE CROSS/BLUE SHIELD | Source: Ambulatory Visit

## 2014-08-16 DIAGNOSIS — Z1231 Encounter for screening mammogram for malignant neoplasm of breast: Secondary | ICD-10-CM

## 2014-08-16 DIAGNOSIS — N951 Menopausal and female climacteric states: Secondary | ICD-10-CM

## 2014-08-16 MED ORDER — PROGESTERONE MICRONIZED 100 MG PO CAPS: 100.0000 mg | ORAL_CAPSULE | Freq: Every day | ORAL | Status: DC

## 2014-08-16 MED ORDER — ESTRADIOL 0.1 MG/24HR TD PTTW: MEDICATED_PATCH | TRANSDERMAL | Status: DC

## 2014-08-16 NOTE — Telephone Encounter (Signed)
Left detailed message at number provided [phone]. Advised I see that patient mammogram was performed [facility]. Will need to wait for results for review by Dr.Miller before refills of hormones can be given. Advised to return call to office with any further questions.

## 2014-08-16 NOTE — Telephone Encounter (Signed)
Pt states she was instructed to call back after she got her mammorgam completed so that she may get a refill. Pt states she is a prior patient of Dr. Charlies Constable but listed Dr. Sabra Heck on her mammogram results.

## 2014-08-19 ENCOUNTER — Ambulatory Visit: Payer: Self-pay

## 2014-10-03 ENCOUNTER — Other Ambulatory Visit: Payer: Self-pay | Admitting: Obstetrics & Gynecology

## 2014-10-04 NOTE — Telephone Encounter (Signed)
Tried to call and s/w patient, got her vm. Left message that we received rx request from pharmacy and we are waiting  to hear back from the provider.

## 2014-10-04 NOTE — Telephone Encounter (Signed)
Medication refill request: Estradiol 0.1 mg patch  Last AEX:  12/12/13 with TL Next AEX: No AEX scheduled for 2016 Last MMG (if hormonal medication request): 08/16/14 bi-rads 1: negative Refill authorized: #8/2 rfs, please advise.

## 2014-10-04 NOTE — Telephone Encounter (Signed)
Patient calling to check on the status of this request. She is needing to change her patch on Sunday. She has some questions about what to do if she cannot get the refill by Sunday. Additionally, patient is traveling to [place] today and will not be available between 9:30 and 4:30. She says it is okay to leave a message.

## 2014-10-04 NOTE — Telephone Encounter (Signed)
Left message on patient's vm that rx has been sent in to pharmacy.

## 2014-10-10 ENCOUNTER — Telehealth: Payer: Self-pay | Admitting: *Deleted

## 2014-10-10 NOTE — Telephone Encounter (Signed)
OK to place a new patch to clean and dry skin now. Let me know if she is having problems with this patch sticking.  Due for annual exam in the fall.   Thanks!

## 2014-10-10 NOTE — Telephone Encounter (Signed)
Left Message To Call Back  

## 2014-10-10 NOTE — Telephone Encounter (Addendum)
Patient appreciative of [place] sending in her prescription last week.  She is using the Vivelle Dot 0.1 mg and taking Progesterone 100 mg. Patient only takes the progesterone at night when she puts on patch.  Patient said she went a couple days without the patch due to her being out of town. She typically puts her patches on Sunday morning and Wednesday afternoons. Patient said she went yesterday to her pharmacy and picked up patch and put it on Tuesday afternoon after she showered. She says it wasn't sticking all that well. Patient has been having light bleeding since Sunday. Patient wants to know how she should proceed in regards to her patch.Should she wait until Sunday to put another one on or go without it for this week and just have a period?   Please advise [name] with patient's next step. (Patient said it's okay to leave detailed message).

## 2014-10-10 NOTE — Telephone Encounter (Signed)
Left detailed message as seen below and for patient to give [place] a call back if she has any issues.  Thanks Dr. Quincy Simmonds!

## 2014-10-10 NOTE — Telephone Encounter (Signed)
Thank you. Encounter closed. 

## 2014-10-15 ENCOUNTER — Other Ambulatory Visit: Payer: Self-pay | Admitting: Obstetrics & Gynecology

## 2014-10-16 MED ORDER — ESTRADIOL 0.1 MG/24HR TD PTTW: MEDICATED_PATCH | TRANSDERMAL | Status: DC

## 2014-10-16 NOTE — Telephone Encounter (Signed)
Patient scheduled her aex 12/27/14 with Dr.Silva.

## 2014-10-16 NOTE — Telephone Encounter (Addendum)
Patient scheduled AEX for 12/27/14 with Dr. Quincy Simmonds, okay to send rfs in to last patient until October? Patient will also run out of Vivelle Dot Patch before then as well.

## 2014-10-16 NOTE — Telephone Encounter (Signed)
Medication refill request: Progesterone 100 mg  Last AEX:  12/12/13 with TL Next AEX: No AEX scheduled  Last MMG (if hormonal medication request): 08/16/14 bi-rads 1: negative Refill authorized: #30/1 rfs

## 2014-10-16 NOTE — Telephone Encounter (Signed)
Patient calling back to to ask if her refills can be extended until her appointment in October.

## 2014-10-18 NOTE — Telephone Encounter (Signed)
Patient notified that rx has been sent into pharmacy

## 2014-11-04 ENCOUNTER — Telehealth: Payer: Self-pay | Admitting: Obstetrics and Gynecology

## 2014-11-04 DIAGNOSIS — N950 Postmenopausal bleeding: Secondary | ICD-10-CM

## 2014-11-04 NOTE — Telephone Encounter (Signed)
Left message to call Kaitlyn at [phone]. 

## 2014-11-04 NOTE — Telephone Encounter (Signed)
Spoke with patient. Patient states that on August 3rd she ran out of her estradiol patch and was one week late replacing her patch. During her patch free week she began to have light spotting and has had off and on spotting ever since. Yesterday she had intercourse and 30 minutes later had a "huge gush of blood what saturated my jeans." Is currently having light spotting. Has not missed or replaced a patch late since Aug 3rd. Is still taking Progesterone 100 mg daily. Advised I will speak with Dr.Silva regarding symptoms and return call. Patient is agreeable. "Can you ask at what point I should be worried. I never have any health problems."   Routing to Dr.Silva for review. Previous Dr.Lathrop patient.

## 2014-11-04 NOTE — Telephone Encounter (Signed)
Patient had an abnormal discharge over the weekend and has questions for the nurse.

## 2014-11-04 NOTE — Telephone Encounter (Signed)
I would like to see patient on August 25 for evaluation of postmenopausal bleeding.  I would like to do sonohysterogram and EMB.  Please explain procedures to patient.   Please schedule and send to precert.   If has continued heavy bleeding, dizziness, or lightheadedness, needs office visit or visit to ER if after hours.

## 2014-11-05 ENCOUNTER — Telehealth: Payer: Self-pay | Admitting: Obstetrics and Gynecology

## 2014-11-05 NOTE — Telephone Encounter (Signed)
Spoke with patient. Advised of message as seen below from [place]. Patient is agreeable and verbalizes understanding. Sonohysterogram and EMB scheduled for 11/07/2014 at 8 am with 8:30 am consult with Dr.Silva (date and time per Dr.Silva). Advised to take 800 mg of ibuprofen/motrin prior to appointment. Orders placed for precert. Patient will call to be seen in office if symptoms worsen or develops new symptoms. Will be seen at ER if changes occur after hours.  Cc: Lora Havens  Routing to provider for final review. Patient agreeable to disposition. Will close encounter.

## 2014-11-05 NOTE — Telephone Encounter (Signed)
Left message to call Kaitlyn at [phone]. 

## 2014-11-05 NOTE — Telephone Encounter (Signed)
Left message at patient's home number [phone] to call Cataleyah Colborn at [phone].

## 2014-11-05 NOTE — Telephone Encounter (Signed)
Attempted call to patient regarding insurance. Unable to verify insurance to provide benefit for upcoming procedure. Called patients listed mobile number "this number has been disconnected or is no longer in service". Unable to complete call. Called listed home number, left voicemail on home number for patient to call back.

## 2014-11-05 NOTE — Telephone Encounter (Signed)
Spoke with patient regarding benefit information. See guarantor notes. Patient understands and agreeable to plan discussed and noted in guarantor notes. Verified appointment date/time and arrival time. Patient agreeable. Ok to close

## 2014-11-05 NOTE — Addendum Note (Signed)
Addended by: Jasmine Awe on: 11/05/2014 09:49 AM   Modules accepted: Orders

## 2014-11-05 NOTE — Telephone Encounter (Signed)
Verified insurance benefits. Called patient to review. Left voicemail at home and work numbers with information: name, department, call back number

## 2014-11-07 ENCOUNTER — Other Ambulatory Visit: Payer: Self-pay | Admitting: Obstetrics and Gynecology

## 2014-11-07 ENCOUNTER — Ambulatory Visit (INDEPENDENT_AMBULATORY_CARE_PROVIDER_SITE_OTHER): Payer: BLUE CROSS/BLUE SHIELD | Admitting: Obstetrics and Gynecology

## 2014-11-07 ENCOUNTER — Ambulatory Visit (INDEPENDENT_AMBULATORY_CARE_PROVIDER_SITE_OTHER): Payer: BLUE CROSS/BLUE SHIELD

## 2014-11-07 ENCOUNTER — Encounter: Payer: Self-pay | Admitting: Obstetrics and Gynecology

## 2014-11-07 VITALS — BP 130/72 | HR 75 | Ht 66.0 in | Wt 170.0 lb

## 2014-11-07 DIAGNOSIS — N950 Postmenopausal bleeding: Secondary | ICD-10-CM

## 2014-11-07 DIAGNOSIS — D259 Leiomyoma of uterus, unspecified: Secondary | ICD-10-CM

## 2014-11-07 NOTE — Progress Notes (Signed)
Subjective  54 y.o. G62P1102  Married [place] American  female here for pelvic ultrasound for  Postmenopausal bleeding which was heavy.   Patient is on HRT.  States had a sonohysterogram in the past and was diagnosed with fibroids.  This was done in 2014 and the fibroids were 1.5 cm and 2.2 cm. Had a benign EMB then as well.  On Vivelle 0.1 mg twice weekly and Prometrium 100 mg daily.  Ran out of estrogen due to need to get mammogram done and need to do this for further refills. Was off estrogen for one week prior to restarting this.  Still having some bleeding.   Some urinary urgency.   Sister with ovarian cancer.  Doing well at age 54 year old. Brother with prostate cancer.  No FH breast cancer.  Toe nail discoloration.   Objective  Pelvic ultrasound images and report reviewed with patient.  Uterus - 2 fibroids - partially submucous 55 x 45 mm, subserosal 1.81 cm. EMS - 5.8 mm Ovaries - normal, no masses. Free fluid - mild, clear free fluid.        Procedure - sonohysterogram Consent performed. Speculum placed in vagina. Sterile prep of cervix with   betadine Cannula placed inside endometrial cavity without difficulty. Speculum removed. Sterile saline injected.              filling defect noted. Cannula removed. No complication.   Procedure - endometrial biopsy Consent performed. Speculum place in vagina.  Sterile prep of cervix with  betadine Tenaculum to anterior cervical lip. Paracervical block with 10 cc 1% lidocaine _______________ no Pipelle placed to     10     cm without difficulty twice. Tissue obtained and sent to pathology. Speculum removed.  No complications. Minimal EBL.  Pelvic exam - uterus 6 week size and retroverted with 4 cm posterior fibroid, nontender.  No adnexal masses or tenderness. Toes - big toes with discoloration of nails.  Assessment  Postmenopausal bleeding  Two fibroids including partially submucosal fibroid.  Fibroids  are enlarging. Toenail discoloration.   Plan   Discussion of fibroids - signs/symptoms, natural history, treatment options - stop HRT versus hysterectomy.   ACOG information on fibroids. Patient will stop all HRT and monitor bleeding.  Follow up EMB. Precautions given for biopsy.  Signs and symptoms of anemia reviewed.  Return for annual exam in October 2016. I recommend patient se dermatologist of choice for toe nail evaluation.   _25______ minutes face to face time of which over 50% was spent in counseling.   After visit summary to patient.

## 2014-11-07 NOTE — Patient Instructions (Signed)
Endometrial Biopsy, Care After Refer to this sheet in the next few weeks. These instructions provide you with information on caring for yourself after your procedure. Your health care provider may also give you more specific instructions. Your treatment has been planned according to current medical practices, but problems sometimes occur. Call your health care provider if you have any problems or questions after your procedure. WHAT TO EXPECT AFTER THE PROCEDURE After your procedure, it is typical to have the following:  You may have mild cramping and a small amount of vaginal bleeding for a few days after the procedure. This is normal. HOME CARE INSTRUCTIONS  Only take over-the-counter or prescription medicine as directed by your health care provider.  Do not douche, use tampons, or have sexual intercourse until your health care provider approves.  Follow your health care provider's instructions regarding any activity restrictions, such as strenuous exercise or heavy lifting. SEEK MEDICAL CARE IF:  You have heavy bleeding or bleeding longer than 2 days after the procedure.  You have bad smelling drainage from your vagina.  You have a fever and chills.  Youhave severe lower stomach (abdominal) pain. SEEK IMMEDIATE MEDICAL CARE IF:  You have severe cramps in your stomach or back.  You pass large blood clots.  Your bleeding increases.  You become weak or lightheaded, or you pass out. Document Released: 12/20/2012 Document Reviewed: 12/20/2012 ExitCare Patient Information 2015 [employer]. This information is not intended to replace advice given to you by your health care provider. Make sure you discuss any questions you have with your health care provider.  

## 2014-11-11 LAB — IPS OTHER TISSUE BIOPSY

## 2014-11-14 ENCOUNTER — Telehealth: Payer: Self-pay | Admitting: Emergency Medicine

## 2014-11-14 NOTE — Telephone Encounter (Signed)
-----   Message from Nunzio Cobbs, MD sent at 11/14/2014  5:49 AM EDT ----- Please inform patient of EMB that is benign.  No hyperplasia or malignancy seen.  This was done for postmenopausal bleeding. She has a submucous fibroid.  She stopped her HRT after the office visit for her [place] and EMB. Please inform me if she has further vaginal bleeding.   Cc- Marisa Sprinkles

## 2014-11-14 NOTE — Telephone Encounter (Signed)
Called patient home per designated party release form.  Female answered and informed patient not home.  Will try again.

## 2014-11-15 NOTE — Telephone Encounter (Signed)
Patient called. Message from Dr. Quincy Simmonds given. Patient states she continues to have vaginal bleeding. Wearing pad, changing every 4-6 hours. Some days are lighter than others.  She denies dizziness, fatigue, lightheadedness. No fevers or pelvic pain.  She has annual exam with Dr. Quincy Simmonds scheduled for 12/27/14, she confirms appointment.   Patient states she will monitor bleeding and return call if continues or if develops symptoms as we discussed, declines appointment for evaluation with Dr. Quincy Simmonds. Advised patient to call back or seek immediate medical care if bleeding worsens or soaking through 1 pad/tampon per hour for two hours or if becomes symptomatic with sob, chest pain, fatigued, lightheaded, or weakness.  Patient verbalized understanding.   Routing to provider for final review.

## 2014-11-15 NOTE — Telephone Encounter (Signed)
I agree with your recommendations. I would like to have the patient return sooner than her October annual exam if the bleeding persists.

## 2014-11-20 NOTE — Telephone Encounter (Signed)
Spoke with patient and she is given message from Dr. Quincy Simmonds.  Patient states that bleeding now is "very minimal, I'm only wearing a mini pad." She declines follow up appointment at this time. Will call back if bleeding persists or increases.  Patient agreeable to instructions.  Routing to provider for final review. Patient agreeable to disposition. Will close encounter.

## 2014-12-27 ENCOUNTER — Encounter: Payer: Self-pay | Admitting: Obstetrics and Gynecology

## 2014-12-27 ENCOUNTER — Ambulatory Visit (INDEPENDENT_AMBULATORY_CARE_PROVIDER_SITE_OTHER): Payer: BLUE CROSS/BLUE SHIELD | Admitting: Obstetrics and Gynecology

## 2014-12-27 DIAGNOSIS — Z01419 Encounter for gynecological examination (general) (routine) without abnormal findings: Secondary | ICD-10-CM | POA: Diagnosis not present

## 2014-12-27 DIAGNOSIS — Z0000 Encounter for general adult medical examination without abnormal findings: Secondary | ICD-10-CM

## 2014-12-27 DIAGNOSIS — R3129 Other microscopic hematuria: Secondary | ICD-10-CM

## 2014-12-27 NOTE — Progress Notes (Signed)
Patient ID: Laura Dixon, female   DOB: [birth year], 54 y.o.   MRN: [id number] 55 y.o. G2P1102 Unknown African American female here for annual exam.    Had bleeding on HRT which prompted evaluation.  Pelvic ultrasound November 07, 2014; Uterus - 2 fibroids - partially submucous 55 x 45 mm, subserosal 1.81 cm. EMS - 5.8 mm Ovaries - normal, no masses. Free fluid - mild, clear free fluid. Had benign endometrial biopsy the same day.   Stopped all HRT following this evaluation.  Some hot flashes but they are manageable.  No further bleeding.   Did wellness labs at work.  Will send a copy to [place].  PCP: [name], MD    Patient's last menstrual period was 07/14/2010.          Sexually active: Yes.   female The current method of family planning is post menopausal status.    Exercising: No.   Smoker:  no  Health Maintenance: Pap:  12-12-13 Neg:Neg HR HPV History of abnormal Pap:  no MMG:  08-16-14 Density Cat.C/Neg/BiRads1:[facility]. Colonoscopy:  2011 normal in [place] .Next due 2021. BMD:   n/a  Result  n/a TDaP:  03-16-11 TD Screening Labs:  Urine today: Trace RBCs some frequency of urination.    reports that she has never smoked. She has never used smokeless tobacco. She reports that she does not drink alcohol or use illicit drugs.  Past Medical History  Diagnosis Date  . Anemia   . Fibroid     Past Surgical History  Procedure Laterality Date  . Tubal ligation  03/15/1993    No current outpatient prescriptions on file.   No current facility-administered medications for this visit.    Family History  Problem Relation Age of Onset  . Ovarian cancer Sister   . Prostate cancer Brother 64    ROS:  Pertinent items are noted in HPI.  Otherwise, a comprehensive ROS was negative.  Exam:   LMP 07/14/2010    General appearance: alert, cooperative and appears stated age Head: Normocephalic, without obvious abnormality, atraumatic Neck: no adenopathy, supple,  symmetrical, trachea midline and thyroid normal to inspection and palpation Lungs: clear to auscultation bilaterally Breasts: normal appearance, no masses or tenderness, Inspection negative, No nipple retraction or dimpling, No nipple discharge or bleeding, No axillary or supraclavicular adenopathy Heart: regular rate and rhythm Abdomen: soft, non-tender; bowel sounds normal; no masses,  no organomegaly Extremities: extremities normal, atraumatic, no cyanosis or edema Skin: Skin color, texture, turgor normal. No rashes or lesions Lymph nodes: Cervical, supraclavicular, and axillary nodes normal. No abnormal inguinal nodes palpated Neurologic: Grossly normal  Pelvic: External genitalia:  no lesions              Urethra:  normal appearing urethra with no masses, tenderness or lesions              Bartholins and Skenes: normal                 Vagina: normal appearing vagina with normal color and discharge, no lesions              Cervix: no lesions              Pap taken: No. Bimanual Exam:  Uterus:  normal size, contour, position, consistency, mobility, non-tender              Adnexa: normal adnexa and no mass, fullness, tenderness  Rectovaginal: Yes.  .  Confirms.              Anus:  normal sphincter tone, no lesions  Chaperone was present for exam.  Assessment:   Well woman visit with normal exam. FH of ovarian cancer and prostate cancer.  Decline genetic testing.  Uterine fibroids, one partially submucous.  No further bleeding since stopping HRT.  Microscopic hematuria.   Plan: Yearly mammogram recommended after age 65.  Recommended self breast exam.  Pap and HR HPV as above. Discussed Calcium, Vitamin D, regular exercise program including cardiovascular and weight bearing exercise. Labs performed.  Yes.  .   See orders.  Urine micro and culture.  Refills given on medications.  No..    Follow up annually and prn.      After visit summary provided.

## 2014-12-27 NOTE — Patient Instructions (Signed)

## 2014-12-28 LAB — URINALYSIS, MICROSCOPIC ONLY
Bacteria, UA: NONE SEEN [HPF]
Casts: NONE SEEN [LPF]
Crystals: NONE SEEN [HPF]
WBC, UA: NONE SEEN WBC/HPF (ref <=5)
YEAST: NONE SEEN [HPF]

## 2014-12-28 LAB — URINE CULTURE: Colony Count: 2000

## 2015-07-11 ENCOUNTER — Other Ambulatory Visit: Payer: Self-pay

## 2015-07-11 DIAGNOSIS — Z1231 Encounter for screening mammogram for malignant neoplasm of breast: Secondary | ICD-10-CM

## 2015-08-22 ENCOUNTER — Ambulatory Visit
Admission: RE | Admit: 2015-08-22 | Discharge: 2015-08-22 | Disposition: A | Discharge status: Home / Self Care | Payer: BLUE CROSS/BLUE SHIELD | Source: Ambulatory Visit

## 2015-08-22 ENCOUNTER — Other Ambulatory Visit: Payer: Self-pay | Admitting: Family Medicine

## 2015-08-22 DIAGNOSIS — Z1231 Encounter for screening mammogram for malignant neoplasm of breast: Secondary | ICD-10-CM

## 2016-01-01 ENCOUNTER — Ambulatory Visit (INDEPENDENT_AMBULATORY_CARE_PROVIDER_SITE_OTHER): Payer: BLUE CROSS/BLUE SHIELD | Admitting: Obstetrics and Gynecology

## 2016-01-01 ENCOUNTER — Encounter: Payer: Self-pay | Admitting: Obstetrics and Gynecology

## 2016-01-01 VITALS — BP 120/72 | HR 88 | Resp 16 | Ht 66.0 in | Wt 182.0 lb

## 2016-01-01 DIAGNOSIS — Z0000 Encounter for general adult medical examination without abnormal findings: Secondary | ICD-10-CM

## 2016-01-01 DIAGNOSIS — Z01419 Encounter for gynecological examination (general) (routine) without abnormal findings: Secondary | ICD-10-CM | POA: Diagnosis not present

## 2016-01-01 DIAGNOSIS — R3129 Other microscopic hematuria: Secondary | ICD-10-CM

## 2016-01-01 LAB — POCT URINALYSIS DIPSTICK
Bilirubin, UA: NEGATIVE
GLUCOSE UA: NEGATIVE
Ketones, UA: NEGATIVE
Nitrite, UA: NEGATIVE
Protein, UA: NEGATIVE
Urobilinogen, UA: NEGATIVE
pH, UA: 5

## 2016-01-01 NOTE — Patient Instructions (Signed)

## 2016-01-01 NOTE — Progress Notes (Signed)
55 y.o. [id number] Unknown African American female here for annual exam.    Hot flashes are reduced.  Not on HRT. Hx fibroid. No further bleeding or spotting.   Having sciatica.  Tx with Prednisone taper. Problem is recurrent.  Unable to golf so is unhappy about this.   Does labs at work for wellness.   PCP:   Dr. Lennon Alstrom [facility]  Patient's last menstrual period was 07/14/2010.           Sexually active: Yes.    The current method of family planning is post menopausal status.    Exercising: Yes.    walking Smoker:  no  Health Maintenance: Pap:  12-12-13 Neg:Neg HR HPV History of abnormal Pap:  no MMG:  08-22-15 Density C/Neg/BiRads1/[facility] Colonoscopy:/Nov.  2011 normal in [place].  Next due 2021.  BMD:   n/a  Result  n/a TDaP:  03-16-11 Gardasil:   N/A HIV:  Declines.  Had done blood conation last year.  Hep C:  Declines.  Had done blood donation last year. Screening Labs:  Hb today: at work, Urine today: 2+ RBC, trace WBC.  Asymptomatic.   reports that she has never smoked. She has never used smokeless tobacco. She reports that she does not drink alcohol or use drugs.  Past Medical History:  Diagnosis Date  . Anemia   . Fibroid     Past Surgical History:  Procedure Laterality Date  . TUBAL LIGATION  03/15/1993    No current outpatient prescriptions on file.   No current facility-administered medications for this visit.     Family History  Problem Relation Age of Onset  . Ovarian cancer Sister   . Prostate cancer Brother 15    ROS:  Pertinent items are noted in HPI.  Otherwise, a comprehensive ROS was negative.  Exam:   BP 120/72 (BP Location: Right Arm, Patient Position: Sitting, Cuff Size: Normal)   Pulse 88   Resp 16   Ht 5\' 6"  (1.676 m)   Wt 182 lb (82.6 kg)   LMP 07/14/2010   BMI 29.38 kg/m     General appearance: alert, cooperative and appears stated age Head: Normocephalic, without obvious abnormality, atraumatic Neck:  no adenopathy, supple, symmetrical, trachea midline and thyroid normal to inspection and palpation Lungs: clear to auscultation bilaterally Breasts: normal appearance, no masses or tenderness, No nipple retraction or dimpling, No nipple discharge or bleeding, No axillary or supraclavicular adenopathy Heart: regular rate and rhythm Abdomen: soft, non-tender; no masses, no organomegaly Extremities: extremities normal, atraumatic, no cyanosis or edema Skin: Skin color, texture, turgor normal. No rashes or lesions Lymph nodes: Cervical, supraclavicular, and axillary nodes normal. No abnormal inguinal nodes palpated Neurologic: Grossly normal  Pelvic: External genitalia:  no lesions              Urethra:  normal appearing urethra with no masses, tenderness or lesions              Bartholins and Skenes: normal                 Vagina: normal appearing vagina with normal color and discharge, no lesions              Cervix: no lesions              Pap taken: No. Bimanual Exam:  Uterus:  normal size, contour, position, consistency, mobility, non-tender  Adnexa: no mass, fullness, tenderness              Rectal exam: Yes.  .  Confirms.              Anus:  normal sphincter tone, no lesions  Chaperone was present for exam.  Assessment:   Well woman visit with microscopic hematuria. FH of ovarian cancer and prostate cancer.  Has declined genetic testing.  Uterine fibroids, one partially submucous.  No further bleeding since stopping HRT.  Microscopic hematuria.   Plan: Yearly mammogram recommended after age 24.  Recommended self breast exam.  Pap and HR HPV as above. Discussed Calcium, Vitamin D, regular exercise program including cardiovascular and weight bearing exercise. Urine micro and cx.    Follow up annually and prn.   After visit summary provided.

## 2016-01-02 LAB — URINALYSIS, MICROSCOPIC ONLY
Bacteria, UA: NONE SEEN [HPF]
CASTS: NONE SEEN [LPF]
Crystals: NONE SEEN [HPF]
RBC / HPF: NONE SEEN RBC/HPF (ref <=2)
Squamous Epithelial / LPF: NONE SEEN [HPF] (ref <=5)
WBC, UA: NONE SEEN WBC/HPF (ref <=5)
YEAST: NONE SEEN [HPF]

## 2016-01-03 LAB — URINE CULTURE

## 2016-01-08 ENCOUNTER — Telehealth: Payer: Self-pay | Admitting: Obstetrics and Gynecology

## 2016-01-08 NOTE — Telephone Encounter (Signed)
Patient calling for results of urine.

## 2016-01-08 NOTE — Telephone Encounter (Signed)
Spoke with patient. Patient calling to request recent lab results. Patient states unable to access MyChart; provided with instructions to web site. Advised as seen below per Dr. Quincy Simmonds. Patient verbalizes understanding and is agreeable.  Routing to provider for final review. Patient is agreeable to disposition. Will close encounter.   Notes Recorded by Nunzio Cobbs, MD on 01/04/2016 at 6:42 PM EDT Results to patient through My Chart.  Hello [name],  I am sharing your urinary test results with you.  There was no sign of blood on the microscopic exam. The culture report showed a normal organism called Group B Streptococcus.  This organism is normal to find in the vagina, and represents vaginal bacteria and not a ture infection in the bladder.  No antibiotics are needed at this time.   Please contact the office for any questions.   Have a great week!  Laura Half, MD

## 2016-01-09 ENCOUNTER — Ambulatory Visit: Payer: BLUE CROSS/BLUE SHIELD | Admitting: Obstetrics and Gynecology

## 2016-04-19 DIAGNOSIS — G8929 Other chronic pain: Secondary | ICD-10-CM | POA: Diagnosis not present

## 2016-04-19 DIAGNOSIS — M25512 Pain in left shoulder: Secondary | ICD-10-CM | POA: Diagnosis not present

## 2016-05-06 DIAGNOSIS — M25512 Pain in left shoulder: Secondary | ICD-10-CM | POA: Diagnosis not present

## 2016-05-06 DIAGNOSIS — G8929 Other chronic pain: Secondary | ICD-10-CM | POA: Diagnosis not present

## 2016-05-27 DIAGNOSIS — M25512 Pain in left shoulder: Secondary | ICD-10-CM | POA: Diagnosis not present

## 2016-05-27 DIAGNOSIS — G8929 Other chronic pain: Secondary | ICD-10-CM | POA: Diagnosis not present

## 2016-06-18 DIAGNOSIS — M25512 Pain in left shoulder: Secondary | ICD-10-CM | POA: Diagnosis not present

## 2016-06-18 DIAGNOSIS — G8929 Other chronic pain: Secondary | ICD-10-CM | POA: Diagnosis not present

## 2016-07-09 DIAGNOSIS — M25512 Pain in left shoulder: Secondary | ICD-10-CM | POA: Diagnosis not present

## 2016-07-09 DIAGNOSIS — G8929 Other chronic pain: Secondary | ICD-10-CM | POA: Diagnosis not present

## 2016-07-22 ENCOUNTER — Other Ambulatory Visit: Payer: Self-pay | Admitting: Obstetrics and Gynecology

## 2016-07-22 DIAGNOSIS — Z1231 Encounter for screening mammogram for malignant neoplasm of breast: Secondary | ICD-10-CM

## 2016-07-30 DIAGNOSIS — G8929 Other chronic pain: Secondary | ICD-10-CM | POA: Diagnosis not present

## 2016-07-30 DIAGNOSIS — M25512 Pain in left shoulder: Secondary | ICD-10-CM | POA: Diagnosis not present

## 2016-08-27 ENCOUNTER — Ambulatory Visit
Admission: RE | Admit: 2016-08-27 | Discharge: 2016-08-27 | Disposition: A | Discharge status: Home / Self Care | Payer: BLUE CROSS/BLUE SHIELD | Source: Ambulatory Visit | Attending: Obstetrics and Gynecology | Admitting: Obstetrics and Gynecology

## 2016-08-27 DIAGNOSIS — Z1231 Encounter for screening mammogram for malignant neoplasm of breast: Secondary | ICD-10-CM | POA: Diagnosis not present

## 2016-12-14 DIAGNOSIS — J209 Acute bronchitis, unspecified: Secondary | ICD-10-CM | POA: Diagnosis not present

## 2016-12-14 DIAGNOSIS — J019 Acute sinusitis, unspecified: Secondary | ICD-10-CM | POA: Diagnosis not present

## 2017-01-06 NOTE — Progress Notes (Signed)
56 y.o. Laura Dixon Married [place] American female here for annual exam.    No vaginal bleeding.  Hot flashes improved.   Up to void at night.  Wants to check her urine for abnormalities today.  Labs done at work.  Flu vaccine at work.  Did shingles vaccine at PCP office.   Urine dip - 1+ RBCs.  PCP: Dr. Freddie Breech - [facility]  Patient's last menstrual period was 08/13/2010.           Sexually active: Yes.    The current method of family planning is post menopausal status.    Exercising: No.  The patient does not participate in regular exercise at present. Smoker:  no  Health Maintenance: Pap: 12-12-13 Neg:Neg HR HPV History of abnormal Pap:  no MMG: 08-27-16 Density C/Neg/BiRads1:[facility] Colonoscopy: 01/2010 normal in [place];next due 01/2020. BMD:   n/a  Result  n/a TDaP:  03-16-11 Gardasil:   no HIV: Declined--had blood donation 2016 Hep C: Declined--had blood donation 2016 Screening Labs: blood work done with job   reports that she has never smoked. She has never used smokeless tobacco. She reports that she does not drink alcohol or use drugs.  Past Medical History:  Diagnosis Date  . Anemia   . Fibroid     Past Surgical History:  Procedure Laterality Date  . TUBAL LIGATION  03/15/1993    No current outpatient prescriptions on file.   No current facility-administered medications for this visit.     Family History  Problem Relation Age of Onset  . Ovarian cancer Sister   . Prostate cancer Brother 38    ROS:  Pertinent items are noted in HPI.  Otherwise, a comprehensive ROS was negative.  Exam:   BP 126/76 (BP Location: Right Arm, Patient Position: Sitting, Cuff Size: Large)   Pulse 76   Resp 16   Ht 5' 5.5" (1.664 m)   Wt 178 lb (80.7 kg)   LMP 08/13/2010   BMI 29.17 kg/m     General appearance: alert, cooperative and appears stated age Head: Normocephalic, without obvious abnormality, atraumatic Neck: no adenopathy, supple,  symmetrical, trachea midline and thyroid normal to inspection and palpation Lungs: clear to auscultation bilaterally Breasts: normal appearance, no masses or tenderness, No nipple retraction or dimpling, No nipple discharge or bleeding, No axillary or supraclavicular adenopathy Heart: regular rate and rhythm Abdomen: soft, non-tender; no masses, no organomegaly Extremities: extremities normal, atraumatic, no cyanosis or edema Skin: Skin color, texture, turgor normal. No rashes or lesions Lymph nodes: Cervical, supraclavicular, and axillary nodes normal. No abnormal inguinal nodes palpated Neurologic: Grossly normal  Pelvic: External genitalia:  no lesions              Urethra:  normal appearing urethra with no masses, tenderness or lesions              Bartholins and Skenes: normal                 Vagina: normal appearing vagina with normal color and discharge, no lesions              Cervix: no lesions              Pap taken: Yes.   Bimanual Exam:  Uterus:  normal size, contour, position, consistency, mobility, non-tender              Adnexa: no mass, fullness, tenderness  Rectal exam: Yes.  .  Confirms.              Anus:  normal sphincter tone, no lesions  Chaperone was present for exam.  Assessment:   Well woman visit with normal exam. FH of ovarian cancer and prostate cancer.  Uterine fibroids, one partially submucous.  Urinary frequency.  Microscopic hematuria.   Plan: Mammogram screening discussed. Recommended self breast awareness. Pap and HR HPV as above. Guidelines for Calcium, Vitamin D, regular exercise program including cardiovascular and weight bearing exercise. Urine micro and culture.  Declines referral for genetic counseling and testing.  Follow up annually and prn.    After visit summary provided.

## 2017-01-07 ENCOUNTER — Encounter: Payer: Self-pay | Admitting: Obstetrics and Gynecology

## 2017-01-07 ENCOUNTER — Ambulatory Visit (INDEPENDENT_AMBULATORY_CARE_PROVIDER_SITE_OTHER): Payer: BLUE CROSS/BLUE SHIELD | Admitting: Obstetrics and Gynecology

## 2017-01-07 ENCOUNTER — Other Ambulatory Visit (HOSPITAL_COMMUNITY)
Admission: RE | Admit: 2017-01-07 | Discharge: 2017-01-07 | Disposition: A | Discharge status: Home / Self Care | Payer: BLUE CROSS/BLUE SHIELD | Source: Ambulatory Visit | Attending: Obstetrics and Gynecology | Admitting: Obstetrics and Gynecology

## 2017-01-07 VITALS — BP 126/76 | HR 76 | Resp 16 | Ht 65.5 in | Wt 178.0 lb

## 2017-01-07 DIAGNOSIS — Z01419 Encounter for gynecological examination (general) (routine) without abnormal findings: Secondary | ICD-10-CM | POA: Diagnosis not present

## 2017-01-07 DIAGNOSIS — R350 Frequency of micturition: Secondary | ICD-10-CM | POA: Insufficient documentation

## 2017-01-07 LAB — POCT URINALYSIS DIPSTICK
Bilirubin, UA: NEGATIVE
Blood, UA: 1
Glucose, UA: NEGATIVE
Ketones, UA: NEGATIVE
Leukocytes, UA: NEGATIVE
NITRITE UA: NEGATIVE
Protein, UA: NEGATIVE
Urobilinogen, UA: 0.2 E.U./dL
pH, UA: 7 (ref 5.0–8.0)

## 2017-01-07 NOTE — Patient Instructions (Signed)

## 2017-01-08 LAB — URINALYSIS, MICROSCOPIC ONLY
Bacteria, UA: NONE SEEN
CASTS: NONE SEEN /LPF

## 2017-01-09 LAB — URINE CULTURE

## 2017-01-11 ENCOUNTER — Telehealth: Payer: Self-pay

## 2017-01-11 LAB — CYTOLOGY - PAP
Diagnosis: NEGATIVE
HPV: NOT DETECTED

## 2017-01-11 MED ORDER — AMPICILLIN 500 MG PO CAPS: 500.0000 mg | ORAL_CAPSULE | Freq: Three times a day (TID) | ORAL | 0 refills | Status: DC

## 2017-01-11 NOTE — Telephone Encounter (Signed)
-----   Message from Nunzio Cobbs, MD sent at 01/10/2017  9:01 AM EDT ----- Please contact patient with results of urine testing.  She did not have any RBCs on the microscopic exam, but she did have calcium oxylate crystals.  Crystals can be associated with kidney stones. Her urine culture did show group B strep.  I am recommending ampicillin 500 mg po tid x 7 days.

## 2017-01-11 NOTE — Telephone Encounter (Signed)
Spoke with patient. Advised of message as seen below from [place]. Patient verbalizes understanding. Rx for Ampicillin 500 mg po tid x 7 days #21 [id number] sent to pharmacy on file. Will close encounter.

## 2017-02-01 ENCOUNTER — Telehealth: Payer: Self-pay | Admitting: Obstetrics and Gynecology

## 2017-02-01 NOTE — Telephone Encounter (Signed)
It is very possible that the group B strep seen in the urine was what we call contamination from the vagina.  Group B strep is normally seen in the vaginal environment.  This therefore, may have not been a true bladder infection.  She does not need retesting unless she is having symptoms of painful urination, blood in urine, or sudden increase in urgency or frequency.

## 2017-02-01 NOTE — Telephone Encounter (Signed)
Patient was seen on 01/07/2017. Urine culture returned showing Group B Step. Given Ampicillin 500 mg po tid x 7 days. Patient was not symptomatic prior to testing and is concerned she will not know if her UTI is cleared without retesting. Routing to Dr.Silva for review.

## 2017-02-01 NOTE — Telephone Encounter (Signed)
Patient would like to speak to a nurse.  Says she had a UTI with no symptoms and [name] gave her medication.  She has finished medication and is wondering if she should get retested to make sure the UTI has cleared up.

## 2017-02-02 NOTE — Telephone Encounter (Signed)
Spoke with patient. Advised of results as seen below from Dr.Miller. Patient verbalizes understanding. Encounter closed. 

## 2017-05-20 DIAGNOSIS — M79604 Pain in right leg: Secondary | ICD-10-CM | POA: Diagnosis not present

## 2017-09-08 ENCOUNTER — Other Ambulatory Visit: Payer: Self-pay | Admitting: Obstetrics and Gynecology

## 2017-09-08 DIAGNOSIS — Z1231 Encounter for screening mammogram for malignant neoplasm of breast: Secondary | ICD-10-CM

## 2017-10-07 ENCOUNTER — Ambulatory Visit: Payer: BLUE CROSS/BLUE SHIELD

## 2017-10-31 ENCOUNTER — Ambulatory Visit
Admission: RE | Admit: 2017-10-31 | Discharge: 2017-10-31 | Disposition: A | Discharge status: Home / Self Care | Payer: BLUE CROSS/BLUE SHIELD | Source: Ambulatory Visit | Attending: Obstetrics and Gynecology | Admitting: Obstetrics and Gynecology

## 2017-10-31 DIAGNOSIS — Z1231 Encounter for screening mammogram for malignant neoplasm of breast: Secondary | ICD-10-CM

## 2018-01-16 ENCOUNTER — Ambulatory Visit: Payer: BLUE CROSS/BLUE SHIELD | Admitting: Obstetrics and Gynecology

## 2018-01-20 ENCOUNTER — Ambulatory Visit (INDEPENDENT_AMBULATORY_CARE_PROVIDER_SITE_OTHER): Payer: BLUE CROSS/BLUE SHIELD | Admitting: Obstetrics and Gynecology

## 2018-01-20 ENCOUNTER — Other Ambulatory Visit: Payer: Self-pay

## 2018-01-20 ENCOUNTER — Encounter: Payer: Self-pay | Admitting: Obstetrics and Gynecology

## 2018-01-20 VITALS — BP 136/78 | HR 76 | Resp 18 | Ht 66.0 in | Wt 181.8 lb

## 2018-01-20 DIAGNOSIS — D219 Benign neoplasm of connective and other soft tissue, unspecified: Secondary | ICD-10-CM

## 2018-01-20 DIAGNOSIS — Z8041 Family history of malignant neoplasm of ovary: Secondary | ICD-10-CM

## 2018-01-20 DIAGNOSIS — Z01419 Encounter for gynecological examination (general) (routine) without abnormal findings: Secondary | ICD-10-CM | POA: Diagnosis not present

## 2018-01-20 DIAGNOSIS — R319 Hematuria, unspecified: Secondary | ICD-10-CM

## 2018-01-20 DIAGNOSIS — Z0000 Encounter for general adult medical examination without abnormal findings: Secondary | ICD-10-CM

## 2018-01-20 LAB — POCT URINALYSIS DIPSTICK
Bilirubin, UA: NEGATIVE
GLUCOSE UA: NEGATIVE
Ketones, UA: NEGATIVE
LEUKOCYTES UA: NEGATIVE
Nitrite, UA: NEGATIVE
PH UA: 5 (ref 5.0–8.0)
Protein, UA: NEGATIVE
UROBILINOGEN UA: 0.2 U/dL

## 2018-01-20 NOTE — Progress Notes (Signed)
57 y.o. G49P1102 Married [place] American female here for annual exam.    No vaginal bleeding.  No pelvic pressure or changes in bowel function.   She is worried about her fibroids.  Is off HRT.   Wants her urine checked.  No pain with urination.  Hx GBS in the urine.   Urine dip today - trace RBC.   PCP: [name], MD    Patient's last menstrual period was 08/13/2010.           Sexually active: Yes.   female The current method of family planning is post menopausal status.    Exercising: Yes.    golfing Smoker:  no  Health Maintenance: Pap: 01-07-17 Neg:neg HR HPV, 12-12-13 Neg:Neg HR HPV History of abnormal Pap:  no MMG: 10-31-17 3D Neg/density C/Birads1 Colonoscopy:  01/2010 normal in [place];next due 01/2020. BMD:   n/a  Result  n/a TDaP:  03-16-11 Gardasil:   no GYK:ZLDJTTSV--XBLTJ donation 2016 Hep C:Declined--blood donation 2016 Screening Labs:  Hb today: through work     reports that she has never smoked. She has never used smokeless tobacco. She reports that she does not drink alcohol or use drugs.  Past Medical History:  Diagnosis Date  . Anemia   . Fibroid     Past Surgical History:  Procedure Laterality Date  . TUBAL LIGATION  03/15/1993    No current outpatient medications on file.   No current facility-administered medications for this visit.     Family History  Problem Relation Age of Onset  . Ovarian cancer Sister   . Prostate cancer Brother 39  niece with breast cancer at 85 - 71.  Review of Systems  All other systems reviewed and are negative.   Exam:   BP 136/78 (BP Location: Right Arm, Patient Position: Sitting, Cuff Size: Large)   Pulse 76   Resp 18   Ht 5\' 6"  (1.676 m)   Wt 181 lb 12.8 oz (82.5 kg)   LMP 08/13/2010   BMI 29.34 kg/m     General appearance: alert, cooperative and appears stated age Head: Normocephalic, without obvious abnormality, atraumatic Neck: no adenopathy, supple, symmetrical, trachea midline and  thyroid normal to inspection and palpation Lungs: clear to auscultation bilaterally Breasts: normal appearance, no masses or tenderness, No nipple retraction or dimpling, No nipple discharge or bleeding, No axillary or supraclavicular adenopathy Heart: regular rate and rhythm Abdomen: soft, non-tender; no masses, no organomegaly Extremities: extremities normal, atraumatic, no cyanosis or edema Skin: Skin color, texture, turgor normal. No rashes or lesions Lymph nodes: Cervical, supraclavicular, and axillary nodes normal. No abnormal inguinal nodes palpated Neurologic: Grossly normal  Pelvic: External genitalia:  no lesions              Urethra:  normal appearing urethra with no masses, tenderness or lesions              Bartholins and Skenes: normal                 Vagina: normal appearing vagina with normal color and discharge, no lesions              Cervix: no lesions              Pap taken: No. Bimanual Exam:  Uterus:  normal size, contour, position, consistency, mobility, non-tender              Adnexa: no mass, fullness, tenderness  Rectal exam: Yes.  .  Confirms.              Anus:  normal sphincter tone, no lesions  Chaperone was present for exam.  Assessment:   Well woman visit with normal exam. FH of ovarian cancer and prostate cancer.   Uterine fibroids, one partially submucous.  Microscopic hematuria.  Hx GBS in urine last year.   Plan: Mammogram screening. Recommended self breast awareness. Pap and HR HPV as above. Guidelines for Calcium, Vitamin D, regular exercise program including cardiovascular and weight bearing exercise. I discussed genetic counseling and testing.  She declines.  Pelvic US. Flu vaccine recommended. Urine micro and culture.  Follow up annually and prn.  After visit summary provided.

## 2018-01-20 NOTE — Patient Instructions (Signed)

## 2018-01-21 LAB — URINALYSIS, MICROSCOPIC ONLY
Bacteria, UA: NONE SEEN
Casts: NONE SEEN /lpf

## 2018-01-22 LAB — URINE CULTURE

## 2018-02-02 ENCOUNTER — Encounter: Payer: Self-pay | Admitting: Obstetrics and Gynecology

## 2018-02-02 ENCOUNTER — Ambulatory Visit (INDEPENDENT_AMBULATORY_CARE_PROVIDER_SITE_OTHER): Payer: BLUE CROSS/BLUE SHIELD

## 2018-02-02 ENCOUNTER — Other Ambulatory Visit: Payer: Self-pay

## 2018-02-02 ENCOUNTER — Ambulatory Visit (INDEPENDENT_AMBULATORY_CARE_PROVIDER_SITE_OTHER): Payer: BLUE CROSS/BLUE SHIELD | Admitting: Obstetrics and Gynecology

## 2018-02-02 VITALS — BP 132/78 | HR 80 | Ht 66.0 in | Wt 183.0 lb

## 2018-02-02 DIAGNOSIS — D219 Benign neoplasm of connective and other soft tissue, unspecified: Secondary | ICD-10-CM | POA: Diagnosis not present

## 2018-02-02 DIAGNOSIS — Z8041 Family history of malignant neoplasm of ovary: Secondary | ICD-10-CM | POA: Diagnosis not present

## 2018-02-02 NOTE — Progress Notes (Signed)
GYNECOLOGY  VISIT   HPI: 58 y.o.   Married  [place] American  female   [phone] with Patient's last menstrual period was 08/13/2010.   here for pelvic ultrasound.   Hx fibroids, one partially submucous.  Denies vaginal bleeding.   GYNECOLOGIC HISTORY: Patient's last menstrual period was 08/13/2010. Contraception:  Postmenopausal Menopausal hormone therapy:  none Last mammogram:  10-31-17 3D Neg/density C/Birads1 Last pap smear: 01-07-17 Neg:neg HR HPV, 12-12-13 Neg:Neg HR HPV        OB History    Gravida  2   Para  2   Term  1   Preterm  1   AB      Living  2     SAB      TAB      Ectopic      Multiple      Live Births  2              Patient Active Problem List   Diagnosis Date Noted  . Menopausal symptoms 06/28/2012  . Postmenopausal bleeding 06/13/2012    Past Medical History:  Diagnosis Date  . Anemia   . Fibroid     Past Surgical History:  Procedure Laterality Date  . TUBAL LIGATION  03/15/1993    No current outpatient medications on file.   No current facility-administered medications for this visit.      ALLERGIES: Meloxicam and Naprosyn [naproxen]  Family History  Problem Relation Age of Onset  . Ovarian cancer Sister   . Prostate cancer Brother 20    Social History   Socioeconomic History  . Marital status: Married    Spouse name: Not on file  . Number of children: Not on file  . Years of education: Not on file  . Highest education level: Not on file  Occupational History  . Not on file  Social Needs  . Financial resource strain: Not on file  . Food insecurity:    Worry: Not on file    Inability: Not on file  . Transportation needs:    Medical: Not on file    Non-medical: Not on file  Tobacco Use  . Smoking status: Never Smoker  . Smokeless tobacco: Never Used  Substance and Sexual Activity  . Alcohol use: No    Alcohol/week: 0.0 standard drinks    Comment: drinks 1/2 glass of wine twice a month  . Drug use: No    . Sexual activity: Yes    Birth control/protection: Surgical    Comment: BTL  Lifestyle  . Physical activity:    Days per week: Not on file    Minutes per session: Not on file  . Stress: Not on file  Relationships  . Social connections:    Talks on phone: Not on file    Gets together: Not on file    Attends religious service: Not on file    Active member of club or organization: Not on file    Attends meetings of clubs or organizations: Not on file    Relationship status: Not on file  . Intimate partner violence:    Fear of current or ex partner: Not on file    Emotionally abused: Not on file    Physically abused: Not on file    Forced sexual activity: Not on file  Other Topics Concern  . Not on file  Social History Narrative  . Not on file    Review of Systems  All other systems reviewed and  are negative.   PHYSICAL EXAMINATION:    BP 132/78 (BP Location: Right Arm, Patient Position: Sitting, Cuff Size: Large)   Pulse 80   Ht 5\' 6"  (1.676 m)   Wt 183 lb (83 kg)   LMP 08/13/2010   BMI 29.54 kg/m     General appearance: alert, cooperative and appears stated age Pelvic US 2 fibroids, 22 and 17 mm, each smaller.  EMS somewhat distorted due to fibroids.  Normal ovaries.  No free fluid.  ASSESSMENT  Fibroids - reduced in size. FH ovarian cancer.  Sister. FH prostate cancer.  Brother.  PLAN  We discussed fibroids.  We reviewed signs and symptoms of fibroids and ovarian cancer.  Discussed genetic counseling and testing.  She currently declines but will consider this.    An After Visit Summary was printed and given to the patient.  __15____ minutes face to face time of which over 50% was spent in counseling.

## 2019-01-02 ENCOUNTER — Other Ambulatory Visit: Payer: Self-pay | Admitting: Obstetrics and Gynecology

## 2019-01-02 DIAGNOSIS — Z1231 Encounter for screening mammogram for malignant neoplasm of breast: Secondary | ICD-10-CM

## 2019-01-26 ENCOUNTER — Ambulatory Visit: Payer: BLUE CROSS/BLUE SHIELD | Admitting: Obstetrics and Gynecology

## 2019-01-26 ENCOUNTER — Other Ambulatory Visit: Payer: Self-pay

## 2019-01-29 NOTE — Progress Notes (Signed)
58 y.o. G5P1102 Married [place] American female here for annual exam.    No vaginal bleeding.  No dysuria, urgency or frequency.   Hx microscopic hematuria for 10 years per patient.  Has never seen urology.   FH ovarian cancer.  Pelvic US 02/02/18 showed 2 small fibroid and normal ovaries.   Urine Dip: 2+RBCs(pt.requested)  PCP: [name], MD    Patient's last menstrual period was 08/13/2010.           Sexually active: No.  The current method of family planning is post menopausal status.    Exercising: No.  Some walking Smoker:  no  Health Maintenance: Pap: 01-07-17 Neg:neg HR HPV, 12-12-13 Neg:Neg HR HPV History of abnormal Pap:  no MMG: 10-31-17 3D/Neg/density C/BiRads1--appt.02-21-19 Colonoscopy: 01/2010 normal;next due 01/2020 BMD:   n/a  Result  n/a TDaP:  03-16-11 Gardasil:   no [id number] donation 2016 Hep C: Declined--blood donation 2016 Screening Labs:  PCP. Flu vaccine:  Recommended.   reports that she has never smoked. She has never used smokeless tobacco. She reports that she does not drink alcohol or use drugs.  Past Medical History:  Diagnosis Date  . Anemia   . Fibroid     Past Surgical History:  Procedure Laterality Date  . TUBAL LIGATION  03/15/1993    No current outpatient medications on file.   No current facility-administered medications for this visit.     Family History  Problem Relation Age of Onset  . Ovarian cancer Sister   . Prostate cancer Brother 80  Niece with bilateral mastectomy.   Review of Systems  All other systems reviewed and are negative.   Exam:   BP 130/82 (Cuff Size: Large)   Pulse 70   Temp (!) 97 F (36.1 C)   Resp 20   Ht 5' 5.5" (1.664 m)   Wt 178 lb (80.7 kg)   LMP 08/13/2010   BMI 29.17 kg/m     General appearance: alert, cooperative and appears stated age Head: normocephalic, without obvious abnormality, atraumatic Neck: no adenopathy, supple, symmetrical, trachea midline and thyroid  normal to inspection and palpation Lungs: clear to auscultation bilaterally Breasts: normal appearance, no masses or tenderness, No nipple retraction or dimpling, No nipple discharge or bleeding, No axillary adenopathy Heart: regular rate and rhythm Abdomen: soft, non-tender; no masses, no organomegaly Extremities: extremities normal, atraumatic, no cyanosis or edema Skin: skin color, texture, turgor normal. No rashes or lesions Lymph nodes: cervical, supraclavicular, and axillary nodes normal. Neurologic: grossly normal  Pelvic: External genitalia:  no lesions              No abnormal inguinal nodes palpated.              Urethra:  normal appearing urethra with no masses, tenderness or lesions              Bartholins and Skenes: normal                 Vagina: normal appearing vagina with normal color and discharge, no lesions              Cervix: no lesions              Pap taken: No. Bimanual Exam:  Uterus:  normal size, contour, position, consistency, mobility, non-tender              Adnexa: no mass, fullness, tenderness Exam limited by [facility].  Rectal exam: Yes.  .  Confirms.              Anus:  normal sphincter tone, no lesions  Chaperone was present for exam.  Assessment:   Well woman visit with normal exam. FH ovarian and prostate cancer. Uterine fibroids.  Microscopic hematuria.  Long standing.   Plan: Mammogram screening discussed. Self breast awareness reviewed. Pap and HR HPV in 2023.  Guidelines for Calcium, Vitamin D, regular exercise program including cardiovascular and weight bearing exercise. Genetic counseling and testing recommended.  Referral placed today.  Long discussion regarding genetic evaluation. Return for pelvic US.  Urine micro and culture.  Referral to urology.  Rationale explained.  Follow up annually and prn.   After visit summary provided.

## 2019-01-30 ENCOUNTER — Other Ambulatory Visit: Payer: Self-pay

## 2019-01-30 ENCOUNTER — Encounter: Payer: Self-pay | Admitting: Obstetrics and Gynecology

## 2019-01-30 ENCOUNTER — Ambulatory Visit (INDEPENDENT_AMBULATORY_CARE_PROVIDER_SITE_OTHER): Payer: BC Managed Care – PPO | Admitting: Obstetrics and Gynecology

## 2019-01-30 VITALS — BP 130/82 | HR 70 | Temp 97.0°F | Resp 20 | Ht 65.5 in | Wt 178.0 lb

## 2019-01-30 DIAGNOSIS — Z8041 Family history of malignant neoplasm of ovary: Secondary | ICD-10-CM

## 2019-01-30 DIAGNOSIS — Z0000 Encounter for general adult medical examination without abnormal findings: Secondary | ICD-10-CM

## 2019-01-30 DIAGNOSIS — Z8042 Family history of malignant neoplasm of prostate: Secondary | ICD-10-CM

## 2019-01-30 DIAGNOSIS — R3129 Other microscopic hematuria: Secondary | ICD-10-CM

## 2019-01-30 DIAGNOSIS — D219 Benign neoplasm of connective and other soft tissue, unspecified: Secondary | ICD-10-CM

## 2019-01-30 DIAGNOSIS — Z01419 Encounter for gynecological examination (general) (routine) without abnormal findings: Secondary | ICD-10-CM

## 2019-01-30 LAB — POCT URINALYSIS DIPSTICK
Bilirubin, UA: NEGATIVE
Glucose, UA: NEGATIVE
Ketones, UA: NEGATIVE
Leukocytes, UA: NEGATIVE
Nitrite, UA: NEGATIVE
Protein, UA: NEGATIVE
Urobilinogen, UA: 0.2 E.U./dL
pH, UA: 6 (ref 5.0–8.0)

## 2019-01-30 NOTE — Patient Instructions (Signed)

## 2019-01-31 ENCOUNTER — Telehealth: Payer: Self-pay | Admitting: Licensed Clinical Social Worker

## 2019-01-31 LAB — URINALYSIS, MICROSCOPIC ONLY
Bacteria, UA: NONE SEEN
Casts: NONE SEEN /lpf
Epithelial Cells (non renal): NONE SEEN /hpf (ref 0–10)

## 2019-01-31 NOTE — Telephone Encounter (Signed)
Pt has been cld and scheduled to see Faith Rogue for genetic counseling on 11/30 at 2pm. Pt has been made aware to arrive 15 minutes early. Provided the pt with the address to our location.

## 2019-02-01 LAB — URINE CULTURE

## 2019-02-09 ENCOUNTER — Telehealth: Payer: Self-pay | Admitting: Licensed Clinical Social Worker

## 2019-02-09 ENCOUNTER — Telehealth: Payer: Self-pay | Admitting: Genetic Counselor

## 2019-02-09 NOTE — Telephone Encounter (Signed)
LM on VM that I was returning her call.  Left CB number for today and my phone number for Monday.

## 2019-02-09 NOTE — Telephone Encounter (Signed)
Called patient regarding 11/30 appointment, left a voicemail. This will be a walk-in visit.

## 2019-02-12 ENCOUNTER — Inpatient Hospital Stay: Payer: BC Managed Care – PPO | Admitting: Licensed Clinical Social Worker

## 2019-02-12 ENCOUNTER — Ambulatory Visit (HOSPITAL_BASED_OUTPATIENT_CLINIC_OR_DEPARTMENT_OTHER): Payer: BC Managed Care – PPO | Admitting: Licensed Clinical Social Worker

## 2019-02-12 ENCOUNTER — Other Ambulatory Visit: Payer: Self-pay | Admitting: Licensed Clinical Social Worker

## 2019-02-12 ENCOUNTER — Inpatient Hospital Stay: Payer: BC Managed Care – PPO

## 2019-02-12 ENCOUNTER — Telehealth: Payer: Self-pay | Admitting: Licensed Clinical Social Worker

## 2019-02-12 ENCOUNTER — Encounter: Payer: BLUE CROSS/BLUE SHIELD | Admitting: Genetic Counselor

## 2019-02-12 ENCOUNTER — Encounter: Payer: Self-pay | Admitting: Licensed Clinical Social Worker

## 2019-02-12 DIAGNOSIS — Z809 Family history of malignant neoplasm, unspecified: Secondary | ICD-10-CM

## 2019-02-12 DIAGNOSIS — Z8481 Family history of carrier of genetic disease: Secondary | ICD-10-CM

## 2019-02-12 DIAGNOSIS — Z803 Family history of malignant neoplasm of breast: Secondary | ICD-10-CM | POA: Insufficient documentation

## 2019-02-12 DIAGNOSIS — Z8042 Family history of malignant neoplasm of prostate: Secondary | ICD-10-CM

## 2019-02-12 NOTE — Telephone Encounter (Signed)
Patient returned voicemail regarding voicemail that was left, per patient's request this will be a virtual visit.

## 2019-02-12 NOTE — Progress Notes (Signed)
REFERRING PROVIDER: Nunzio Cobbs, MD [address] [place],  [place] [address]  PRIMARY PROVIDER:  [name], MD  PRIMARY REASON FOR VISIT:  1. Family history of prostate cancer   2. Family history of cancer   3. Family history of breast cancer   4. Family history of BRCA2 gene positive    I connected with Laura Dixon on 02/12/2019 at 2:00 PM by MyChart video visit and verified that I am speaking with the correct person using two identifiers.    Patient location: home Provider location: clinic   HISTORY OF PRESENT ILLNESS:   Laura Dixon, a 58 y.o. female, was seen for a [place] cancer genetics consultation due to a family history of cancer.  Laura Dixon presents to clinic today to discuss the possibility of a hereditary predisposition to cancer, genetic testing, and to further clarify her future cancer risks, as well as potential cancer risks for family members.    Laura Dixon is a 58 y.o. female with no personal history of cancer.    CANCER HISTORY:  Oncology History   No history exists.     RISK FACTORS:  Menarche was at age 62.  First live birth at age 55.  OCP use for approximately 20 years.  Ovaries intact: yes.  Hysterectomy: no.  Menopausal status: postmenopausal.  HRT use: 0 years. Colonoscopy: yes; normal. Mammogram within the last year: yes. Number of breast biopsies: 0. Up to date with pelvic exams: yes. Any excessive radiation exposure in the past: no  Past Medical History:  Diagnosis Date  . Anemia   . Family history of BRCA2 gene positive   . Family history of breast cancer   . Family history of cancer   . Family history of prostate cancer   . Fibroid     Past Surgical History:  Procedure Laterality Date  . TUBAL LIGATION  03/15/1993    Social History   Socioeconomic History  . Marital status: Married    Spouse name: Not on file  . Number of children: Not on file  . Years of education: Not on file  . Highest  education level: Not on file  Occupational History  . Not on file  Social Needs  . Financial resource strain: Not on file  . Food insecurity    Worry: Not on file    Inability: Not on file  . Transportation needs    Medical: Not on file    Non-medical: Not on file  Tobacco Use  . Smoking status: Never Smoker  . Smokeless tobacco: Never Used  Substance and Sexual Activity  . Alcohol use: No    Alcohol/week: 0.0 standard drinks    Comment: drinks 1/2 glass of wine twice a month  . Drug use: No  . Sexual activity: Not Currently    Birth control/protection: Surgical    Comment: BTL  Lifestyle  . Physical activity    Days per week: Not on file    Minutes per session: Not on file  . Stress: Not on file  Relationships  . Social Herbalist on phone: Not on file    Gets together: Not on file    Attends religious service: Not on file    Active member of club or organization: Not on file    Attends meetings of clubs or organizations: Not on file    Relationship status: Not on file  Other Topics Concern  . Not on file  Social History Narrative  . Not on file     FAMILY HISTORY:  We obtained a detailed, 4-generation family history.  Significant diagnoses are listed below: Family History  Problem Relation Age of Onset  . Cancer Sister        possibly ovarian, unsure  . Prostate cancer Brother 53  . Cancer Maternal Aunt        possibly ovarian  . Cancer Paternal Grandfather        unsure type  . Lung cancer Paternal Uncle   . Breast cancer Niece 77       "BRCA2+", daugher of Laura Dixon   Laura Dixon has a daughter, Laura Dixon, 18, and a son, Laura Dixon, 75, with no history of cancer. She had 7 sisters and 6 brothers. One of her sisters died at birth. A brother died at 95, not cancer related. One of her brothers had prostate cancer at 35 and is living at 43, doing well. One of her sisters had cancer in her 30s/40s and thought it was ovarian cancer, but patient asked her sister and  she said it was a different type of cancer. She had hysterectomy at the time. Laura Dixon has a niece, the son of her brother Laura Dixon, who had breast cancer at 28 recently. She had genetic testing that reportedly revealed a BRCA2 mutation, but patient does not have copy of report for review today.   Laura Dixon mother died at 57, no cancer history. Patient had 6 maternal aunts and 1 maternal uncle. One of her aunts had cancer, she thinks possibly ovarian, but unsure exact type. No known cancers in maternal cousins. Maternal grandmother died at 45 due to whooping cough or pneumonia. Maternal grandfather died at 71.   Laura Dixon father died at 55. Patient had approximately 10 aunts and uncles. One of her uncles had lung cancer at 39. No known cancers in paternal cousins. Paternal grandfather did have cancer but patient is unsure the type. Paternal grandmother did not have cancer.  Laura Dixon is aware of previous family history of genetic testing for hereditary cancer risks. There is no reported Ashkenazi Jewish ancestry. There is no known consanguinity.  GENETIC COUNSELING ASSESSMENT: Laura Dixon is a 58 y.o. female with a family history of a BRCA2 mutation. We, therefore, discussed and recommended the following at today's visit.   DISCUSSION: We discussed that 5 - 10% of cancer is hereditary, and that the BRCA2 gene is associated with hereditary cancer. The history she reported is not highly suspicious for a BRCA2 mutation, and it is possible that this mutation is coming from her niece's mother's side of the family. However, if the cancers she believes may be ovarian really were then the family history of would be more fitting. Patient does not believe her brother (niece's father) had testing. There are other genes that can be associated with hereditary cancer syndromes as well.  We discussed that testing is beneficial for several reasons including knowing how to follow individuals for cancer screenings and understand if  other family members could be at risk for cancer and allow them to undergo genetic testing.   We reviewed the characteristics, features and inheritance patterns of hereditary cancer syndromes. We also discussed genetic testing, including the appropriate family members to test, the process of testing, insurance coverage and turn-around-time for results. We discussed the implications of a negative, positive and/or variant of uncertain significant result. We recommended Laura Dixon pursue genetic testing for the [facility] Common Hereditary Cancers gene panel.  The Common Hereditary Cancers Panel offered by [facility] includes sequencing and/or deletion duplication testing of the following 48 genes: APC, ATM, AXIN2, BARD1, BMPR1A, BRCA1, BRCA2, BRIP1, CDH1, CDKN2A (p14ARF), CDKN2A (p16INK4a), CKD4, CHEK2, CTNNA1, DICER1, EPCAM (Deletion/duplication testing only), GREM1 (promoter region deletion/duplication testing only), KIT, MEN1, MLH1, MSH2, MSH3, MSH6, MUTYH, NBN, NF1, NHTL1, PALB2, PDGFRA, PMS2, POLD1, POLE, PTEN, RAD50, RAD51C, RAD51D, RNF43, SDHB, SDHC, SDHD, SMAD4, SMARCA4. STK11, TP53, TSC1, TSC2, and VHL.  The following genes were evaluated for sequence changes only: SDHA and HOXB13 c.251G>A variant only.  Based on Laura Dixon family  history of cancer, she meets medical criteria for genetic testing. Despite that she meets criteria, she may still have an out of pocket cost.   PLAN: After considering the risks, benefits, and limitations, Laura Dixon provided informed consent to pursue genetic testing and the blood sample was sent to [facility] [place] for analysis of the Common Hereditary Cancers Panel. Results should be available within approximately 2-3 weeks' time, at which point they will be disclosed by telephone to Laura Dixon, as will any additional recommendations warranted by these results. Laura Dixon will receive a summary of her genetic counseling visit and a copy of her results once available. This information  will also be available in Epic.   Based on Laura Dixon family history, we recommended her brother Laura Dixon have genetic counseling and testing, as his result would be most informative for the rest of the family should Laura Dixon test negative. Ms. Jalomo will let us know if we can be of any assistance in coordinating genetic counseling and/or testing for this family member.   Ms. Zahradnik questions were answered to her satisfaction today. Our contact information was provided should additional questions or concerns arise. Thank you for the referral and allowing [place] to share in the care of your patient.   Faith Rogue, MS, [facility] Genetic Counselor [email or web address] Phone: [phone]  The patient was seen for a total of 35 minutes in virtual genetic counseling.  Drs. Magrinat, Lindi Adie and/or Burr Medico were available for discussion regarding this case.   _______________________________________________________________________ For Office Staff:  Number of people involved in session: 1 Was an Intern/ student involved with case: no

## 2019-02-21 ENCOUNTER — Ambulatory Visit: Payer: BLUE CROSS/BLUE SHIELD

## 2019-02-22 ENCOUNTER — Telehealth: Payer: Self-pay | Admitting: Obstetrics and Gynecology

## 2019-02-22 ENCOUNTER — Other Ambulatory Visit: Payer: Self-pay

## 2019-02-22 ENCOUNTER — Ambulatory Visit
Admission: RE | Admit: 2019-02-22 | Discharge: 2019-02-22 | Disposition: A | Discharge status: Home / Self Care | Payer: BC Managed Care – PPO | Source: Ambulatory Visit | Attending: Obstetrics and Gynecology | Admitting: Obstetrics and Gynecology

## 2019-02-22 DIAGNOSIS — Z1231 Encounter for screening mammogram for malignant neoplasm of breast: Secondary | ICD-10-CM | POA: Diagnosis not present

## 2019-02-22 NOTE — Telephone Encounter (Signed)
Call placed to patient to review benefit and to schedule recommended ultrasound. Left voicemail message requesting a return call.

## 2019-02-23 ENCOUNTER — Other Ambulatory Visit: Payer: Self-pay | Admitting: Obstetrics and Gynecology

## 2019-02-23 DIAGNOSIS — R928 Other abnormal and inconclusive findings on diagnostic imaging of breast: Secondary | ICD-10-CM

## 2019-02-27 NOTE — Telephone Encounter (Signed)
Call placed to patient in regards to recommended ultrasound appointment. Patient is aware of benefit information. Patient states she will call back to scheduled

## 2019-03-01 ENCOUNTER — Encounter: Payer: Self-pay | Admitting: Obstetrics and Gynecology

## 2019-03-01 ENCOUNTER — Ambulatory Visit (INDEPENDENT_AMBULATORY_CARE_PROVIDER_SITE_OTHER): Payer: BC Managed Care – PPO | Admitting: Obstetrics and Gynecology

## 2019-03-01 ENCOUNTER — Ambulatory Visit
Admission: RE | Admit: 2019-03-01 | Discharge: 2019-03-01 | Disposition: A | Discharge status: Home / Self Care | Payer: BC Managed Care – PPO | Source: Ambulatory Visit | Attending: Obstetrics and Gynecology | Admitting: Obstetrics and Gynecology

## 2019-03-01 ENCOUNTER — Ambulatory Visit (INDEPENDENT_AMBULATORY_CARE_PROVIDER_SITE_OTHER): Payer: BC Managed Care – PPO

## 2019-03-01 ENCOUNTER — Other Ambulatory Visit: Payer: Self-pay

## 2019-03-01 ENCOUNTER — Ambulatory Visit: Payer: BC Managed Care – PPO

## 2019-03-01 ENCOUNTER — Other Ambulatory Visit: Payer: Self-pay | Admitting: Obstetrics and Gynecology

## 2019-03-01 VITALS — BP 130/76 | HR 76 | Temp 97.1°F | Ht 65.5 in | Wt 177.8 lb

## 2019-03-01 DIAGNOSIS — Z8481 Family history of carrier of genetic disease: Secondary | ICD-10-CM | POA: Diagnosis not present

## 2019-03-01 DIAGNOSIS — R928 Other abnormal and inconclusive findings on diagnostic imaging of breast: Secondary | ICD-10-CM

## 2019-03-01 DIAGNOSIS — R922 Inconclusive mammogram: Secondary | ICD-10-CM | POA: Diagnosis not present

## 2019-03-01 DIAGNOSIS — Z8041 Family history of malignant neoplasm of ovary: Secondary | ICD-10-CM | POA: Diagnosis not present

## 2019-03-01 DIAGNOSIS — N6489 Other specified disorders of breast: Secondary | ICD-10-CM | POA: Diagnosis not present

## 2019-03-01 DIAGNOSIS — D219 Benign neoplasm of connective and other soft tissue, unspecified: Secondary | ICD-10-CM | POA: Diagnosis not present

## 2019-03-01 NOTE — Progress Notes (Signed)
GYNECOLOGY  VISIT   HPI: 58 y.o.   Married  [place] American  female   [phone] with Patient's last menstrual period was 08/13/2010.   here for pelvic ultrasound.   FH cancer of breast and prostate and a niece with BRCA2.  Patient's sister had a cancer which may have been ovarian.  Patient has completed genetic evaluation and testing and is waiting for results.  She has a dx mammogram this am for a right breast asymmetry.   GYNECOLOGIC HISTORY: Patient's last menstrual period was 08/13/2010. Contraception: Postmenopausal Menopausal hormone therapy: none Last mammogram: 02-22-19 3D/Lt.Br.Neg,Rt.Br.poss.Asymmetry--warrants further evaluation--Diag.Rt.scheduled 03-01-19 Last pap smear:   01-07-17 Neg:neg HR HPV, 12-12-13 Neg:Neg HR HPV        OB History    Gravida  2   Para  2   Term  1   Preterm  1   AB      Living  2     SAB      TAB      Ectopic      Multiple      Live Births  2              Patient Active Problem List   Diagnosis Date Noted  . Family history of prostate cancer   . Family history of cancer   . Family history of breast cancer   . Family history of BRCA2 gene positive   . Menopausal symptoms 06/28/2012    Past Medical History:  Diagnosis Date  . Anemia   . Family history of BRCA2 gene positive   . Family history of breast cancer   . Family history of cancer   . Family history of prostate cancer   . Fibroid     Past Surgical History:  Procedure Laterality Date  . TUBAL LIGATION  03/15/1993    No current outpatient medications on file.   No current facility-administered medications for this visit.     ALLERGIES: Meloxicam and Naprosyn [naproxen]  Family History  Problem Relation Age of Onset  . Cancer Sister        possibly ovarian, unsure  . Prostate cancer Brother 91  . Cancer Maternal Aunt        possibly ovarian  . Cancer Paternal Grandfather        unsure type  . Lung cancer Paternal Uncle   . Breast cancer Niece  6       "BRCA2+", daugher of Rolan Bucco    Social History   Socioeconomic History  . Marital status: Married    Spouse name: Not on file  . Number of children: Not on file  . Years of education: Not on file  . Highest education level: Not on file  Occupational History  . Not on file  Tobacco Use  . Smoking status: Never Smoker  . Smokeless tobacco: Never Used  Substance and Sexual Activity  . Alcohol use: No    Alcohol/week: 0.0 standard drinks    Comment: drinks 1/2 glass of wine twice a month  . Drug use: No  . Sexual activity: Not Currently    Birth control/protection: Surgical    Comment: BTL  Other Topics Concern  . Not on file  Social History Narrative  . Not on file   Social Determinants of Health   Financial Resource Strain:   . Difficulty of Paying Living Expenses: Not on file  Food Insecurity:   . Worried About Charity fundraiser in the Last Year: Not  on file  . Ran Out of Food in the Last Year: Not on file  Transportation Needs:   . Lack of Transportation (Medical): Not on file  . Lack of Transportation (Non-Medical): Not on file  Physical Activity:   . Days of Exercise per Week: Not on file  . Minutes of Exercise per Session: Not on file  Stress:   . Feeling of Stress : Not on file  Social Connections:   . Frequency of Communication with Friends and Family: Not on file  . Frequency of Social Gatherings with Friends and Family: Not on file  . Attends Religious Services: Not on file  . Active Member of Clubs or Organizations: Not on file  . Attends Archivist Meetings: Not on file  . Marital Status: Not on file  Intimate Partner Violence:   . Fear of Current or Ex-Partner: Not on file  . Emotionally Abused: Not on file  . Physically Abused: Not on file  . Sexually Abused: Not on file    Review of Systems  All other systems reviewed and are negative.   PHYSICAL EXAMINATION:    BP 130/76 (Cuff Size: Large)   Pulse 76   Temp (!) 97.1  F (36.2 C)   Ht 5' 5.5" (1.664 m)   Wt 177 lb 12.8 oz (80.6 kg)   LMP 08/13/2010   BMI 29.14 kg/m     General appearance: alert, cooperative and appears stated age  Pelvic US Uterus with 2 fibroids - 23 and 20 mm with cystic degeneration.  Fibroids previously seen on [place].  EMS 3.39 mm.  Ovaries normal.  No free fluid.  ASSESSMENT  FH prostate and breast cancer.  Possible ovarian cancer.  FH positive BRCA.   PLAN  We discussed genetic testing and the implications of positive or negative results.  We reviewed screening for ovarian cancer, which has no defined protocol. I discussed and offered CA125, and she declined.  We did review the limits of this test.  We reviewed signs and symptoms of ovarian cancer.  She will complete her diagnostic breast evaluation today.  Fu for annual exam and prn.    An After Visit Summary was printed and given to the patient.  __15____ minutes face to face time of which over 50% was spent in counseling.

## 2019-03-07 ENCOUNTER — Encounter: Payer: Self-pay | Admitting: Licensed Clinical Social Worker

## 2019-03-07 ENCOUNTER — Telehealth: Payer: Self-pay | Admitting: Licensed Clinical Social Worker

## 2019-03-07 ENCOUNTER — Ambulatory Visit: Payer: Self-pay | Admitting: Licensed Clinical Social Worker

## 2019-03-07 DIAGNOSIS — Z1379 Encounter for other screening for genetic and chromosomal anomalies: Secondary | ICD-10-CM | POA: Insufficient documentation

## 2019-03-07 NOTE — Progress Notes (Signed)
HPI:  Laura Dixon was previously seen in the [facility] due to a family history of cancer and concerns regarding a hereditary predisposition to cancer. Please refer to our prior cancer genetics clinic note for more information regarding our discussion, assessment and recommendations, at the time. Laura Dixon recent genetic test results were disclosed to her, as were recommendations warranted by these results. These results and recommendations are discussed in more detail below.  CANCER HISTORY:  Oncology History   No history exists.    FAMILY HISTORY:  We obtained a detailed, 4-generation family history.  Significant diagnoses are listed below: Family History  Problem Relation Age of Onset  . Cancer Sister        possibly ovarian, unsure  . Prostate cancer Brother 25  . Cancer Maternal Aunt        possibly ovarian  . Cancer Paternal Grandfather        unsure type  . Lung cancer Paternal Uncle   . Breast cancer Niece 44       "BRCA2+", daugher of Rolan Bucco    Laura Dixon has a daughter, Laura Dixon, 65, and a son, Laura Dixon, 57, with no history of cancer. She had 7 sisters and 6 brothers. One of her sisters died at birth. A brother died at 39, not cancer related. One of her brothers had prostate cancer at 92 and is living at 5, doing well. One of her sisters had cancer in her 30s/40s and thought it was ovarian cancer, but patient asked her sister and she said it was a different type of cancer. She had hysterectomy at the time. Laura Dixon has a niece, the son of her brother Rolan Bucco, who had breast cancer at 44 recently. She had genetic testing that reportedly revealed a BRCA2 mutation, but patient does not have copy of report for review today.   Laura Dixon mother died at 84, no cancer history. Patient had 6 maternal aunts and 1 maternal uncle. One of her aunts had cancer, she thinks possibly ovarian, but unsure exact type. No known cancers in maternal cousins. Maternal grandmother died at 25  due to whooping cough or pneumonia. Maternal grandfather died at 65.   Laura Dixon father died at 47. Patient had approximately 10 aunts and uncles. One of her uncles had lung cancer at 60. No known cancers in paternal cousins. Paternal grandfather did have cancer but patient is unsure the type. Paternal grandmother did not have cancer.  Laura Dixon is aware of previous family history of genetic testing for hereditary cancer risks. There is no reported Ashkenazi Jewish ancestry. There is no known consanguinity.  GENETIC TEST RESULTS: Genetic testing reported out on 03/06/2019 through the [facility] Common Hereditary cancer panel found no pathogenic mutations.   The Common Hereditary Cancers Panel offered by [facility] includes sequencing and/or deletion duplication testing of the following 48 genes: APC, ATM, AXIN2, BARD1, BMPR1A, BRCA1, BRCA2, BRIP1, CDH1, CDKN2A (p14ARF), CDKN2A (p16INK4a), CKD4, CHEK2, CTNNA1, DICER1, EPCAM (Deletion/duplication testing only), GREM1 (promoter region deletion/duplication testing only), KIT, MEN1, MLH1, MSH2, MSH3, MSH6, MUTYH, NBN, NF1, NHTL1, PALB2, PDGFRA, PMS2, POLD1, POLE, PTEN, RAD50, RAD51C, RAD51D, RNF43, SDHB, SDHC, SDHD, SMAD4, SMARCA4. STK11, TP53, TSC1, TSC2, and VHL.  The following genes were evaluated for sequence changes only: SDHA and HOXB13 c.251G>A variant only.  The test report has been scanned into EPIC and is located under the Molecular Pathology section of the Results Review tab.  A portion of the result report is included below for  reference.    We discussed with Laura Dixon that because current genetic testing is not perfect, it is possible there may be a gene mutation in one of these genes that current testing cannot detect, but that chance is small.  We also discussed, that there could be another gene that has not yet been discovered, or that we have not yet tested, that is responsible for the cancer diagnoses in the family.  It is also possible there is a  hereditary cause for the cancer in the family that Ms. Deckman did not inherit and therefore was not identified in her testing.  She does report that her niece tested positive for BRCA2, although the report is not available for review at this time. Therefore, it is important to remain in touch with cancer genetics in the future so that we can continue to offer Laura Dixon the most up to date genetic testing.   ADDITIONAL GENETIC TESTING: We discussed with [name] that her genetic testing was fairly extensive.  If there are genes identified to increase cancer risk that can be analyzed in the future, we would be happy to discuss and coordinate this testing at that time. Additionally, if she obtains a copy of her niece's genetic test report we can double check with the lab that they would have been able to identify that specific BRCA2 variant.   CANCER SCREENING RECOMMENDATIONS: Laura Dixon test result is considered negative (normal).  This means that we have not identified a hereditary cause for her family history of cancer at this time in her.   While reassuring, this does not definitively rule out a hereditary predisposition to cancer. It is still possible that there could be genetic mutations that are undetectable by current technology. There could be genetic mutations in genes that have not been tested or identified to increase cancer risk.  Therefore, it is recommended she continue to follow the cancer management and screening guidelines provided by her  primary healthcare provider.   An individual's cancer risk and medical management are not determined by genetic test results alone. Overall cancer risk assessment incorporates additional factors, including personal medical history, family history, and any available genetic information that may result in a personalized plan for cancer prevention and surveillance.  RECOMMENDATIONS FOR FAMILY MEMBERS:  Relatives in this family might be at some increased risk of  developing cancer, over the general population risk, simply due to the family history of cancer.  We recommended female relatives in this family have a yearly mammogram beginning at age 36, or 31 years younger than the earliest onset of cancer, an annual clinical breast exam, and perform monthly breast self-exams. Female relatives in this family should also have a gynecological exam as recommended by their primary provider. All family members should have a colonoscopy by age 24, or as directed by their physicians.  Based on Ms. Dorsainvil family history of BRCA2+, we recommended her siblings and nieces/nephews have genetic counseling and testing, especially her brother who is the father of her niece who reportedly tested positive. Should this brother test negative, then we would know the BRCA2 variant is coming from her niece's other side of the family.  Ms. Digeronimo will let us know if we can be of any assistance in coordinating genetic counseling and/or testing for these family members.  FOLLOW-UP: Lastly, we discussed with Ms. Plaugher that cancer genetics is a rapidly advancing field and it is possible that new genetic tests will be appropriate for her and/or  her family members in the future. We encouraged her to remain in contact with cancer genetics on an annual basis so we can update her personal and family histories and let her know of advances in cancer genetics that may benefit this family.   Our contact number was provided. Ms. Battey questions were answered to her satisfaction, and she knows she is welcome to call us at anytime with additional questions or concerns.   Faith Rogue, MS, [facility] Genetic Counselor [email or web address] Phone: [phone]

## 2019-03-07 NOTE — Telephone Encounter (Signed)
Revealed negative genetic testing.  This normal result is reassuring.  It is unlikely that there is an increased risk of cancer due to a mutation in one of these genes.  However, genetic testing is not perfect, and cannot definitively rule out a hereditary cause.  It will be important for her to keep in contact with genetics to learn if any additional testing may be needed in the future.      

## 2019-03-27 ENCOUNTER — Ambulatory Visit: Payer: BLUE CROSS/BLUE SHIELD | Admitting: Obstetrics and Gynecology

## 2020-01-30 NOTE — Progress Notes (Signed)
59 y.o. G81P1102 Married [place] American female here for annual exam.    Denies vaginal bleeding.  No changes in bladder or bowel function or control.  No pain.   She will schedule an appointment with her PCP for her routine labs.   No flu vaccine this year.  Completed her Covid vaccine.   PCP: [name], MD  Patient's last menstrual period was 08/13/2010.           Sexually active: No.  The current method of family planning is post menopausal status.    Exercising: Yes.    walking mostly and golf Smoker:  no  Health Maintenance: Pap:  01-07-17 Neg:neg HR HPV, 12-12-13 Neg:Neg HR HPV History of abnormal Pap:  no MMG: 02-22-19 3D/Rt.Br.poss.asymmetry, Lt.Br.Neg/density c. Rt.Br.Diag.& US/Neg/screening [id number].  Has an appointment for 02/14/20. Colonoscopy: 01/2010 normal;next due 01/2020.  She will do next year.  BMD: n/a  Result  n/a TDaP:  03-16-11 Gardasil:   no HIV: donated blood 2016 Hep C: donated blood 2016 Screening Labs:  PCP.  Shingles:  Completed old vaccine.    reports that she has never smoked. She has never used smokeless tobacco. She reports that she does not drink alcohol and does not use drugs.  Past Medical History:  Diagnosis Date   Anemia    Family history of BRCA2 gene positive    Family history of breast cancer    Family history of cancer    Family history of prostate cancer    Fibroid     Past Surgical History:  Procedure Laterality Date   TUBAL LIGATION  03/15/1993    No current outpatient medications on file.   No current facility-administered medications for this visit.    Family History  Problem Relation Age of Onset   Cancer Sister        possibly ovarian, unsure   Prostate cancer Brother 569  Cancer Maternal Aunt        possibly ovarian   Cancer Paternal Grandfather        unsure type   Lung cancer Paternal Uncle    Breast cancer Niece 469      "BRCA2+", daugher of JRolan Bucco   Review of Systems  All  other systems reviewed and are negative.   Exam:   BP (!) 148/70 (Cuff Size: Large)    Pulse 85    Ht 5' 5.25" (1.657 m)    Wt 180 lb (81.6 kg)    LMP 08/13/2010    SpO2 100%    BMI 29.72 kg/m     General appearance: alert, cooperative and appears stated age Head: normocephalic, without obvious abnormality, atraumatic Neck: no adenopathy, supple, symmetrical, trachea midline and thyroid normal to inspection and palpation Lungs: clear to auscultation bilaterally Breasts: normal appearance, no masses or tenderness, No nipple retraction or dimpling, No nipple discharge or bleeding, No axillary adenopathy Heart: regular rate and rhythm Abdomen: soft, non-tender; no masses, no organomegaly Extremities: extremities normal, atraumatic, no cyanosis or edema Skin: skin color, texture, turgor normal. No rashes or lesions Lymph nodes: cervical, supraclavicular, and axillary nodes normal. Neurologic: grossly normal  Pelvic: External genitalia:  no lesions              No abnormal inguinal nodes palpated.              Urethra:  normal appearing urethra with no masses, tenderness or lesions              Bartholins  and Skenes: normal                 Vagina: normal appearing vagina with normal color and discharge, no lesions              Cervix: no lesions              Pap taken: No. Bimanual Exam:  Uterus:  normal size, contour, position, consistency, mobility, non-tender              Adnexa: no mass, fullness, tenderness              Rectal exam: Yes.  .  Confirms.              Anus:  normal sphincter tone, no lesions  Chaperone was present for exam.  Assessment:   Well woman visit with normal exam. FH ovarian and prostate cancer.  Patient had negative genetic testing.  Uterine fibroids.   Plan: Mammogram screening discussed. Self breast awareness reviewed. Pap and HR HPV as above. Guidelines for Calcium, Vitamin D, regular exercise program including cardiovascular and weight bearing  exercise. She will call if she needs assistance with referral to GI. Follow up annually and prn.

## 2020-01-31 ENCOUNTER — Ambulatory Visit (INDEPENDENT_AMBULATORY_CARE_PROVIDER_SITE_OTHER): Payer: BC Managed Care – PPO | Admitting: Obstetrics and Gynecology

## 2020-01-31 ENCOUNTER — Other Ambulatory Visit: Payer: Self-pay

## 2020-01-31 ENCOUNTER — Encounter: Payer: Self-pay | Admitting: Obstetrics and Gynecology

## 2020-01-31 ENCOUNTER — Other Ambulatory Visit: Payer: Self-pay | Admitting: Obstetrics and Gynecology

## 2020-01-31 ENCOUNTER — Ambulatory Visit: Payer: BC Managed Care – PPO

## 2020-01-31 VITALS — BP 148/70 | HR 85 | Ht 65.25 in | Wt 180.0 lb

## 2020-01-31 DIAGNOSIS — Z01419 Encounter for gynecological examination (general) (routine) without abnormal findings: Secondary | ICD-10-CM

## 2020-01-31 DIAGNOSIS — Z1231 Encounter for screening mammogram for malignant neoplasm of breast: Secondary | ICD-10-CM

## 2020-01-31 NOTE — Patient Instructions (Signed)

## 2020-02-14 ENCOUNTER — Ambulatory Visit
Admission: RE | Admit: 2020-02-14 | Discharge: 2020-02-14 | Disposition: A | Discharge status: Home / Self Care | Payer: BC Managed Care – PPO | Source: Ambulatory Visit | Attending: Obstetrics and Gynecology | Admitting: Obstetrics and Gynecology

## 2020-02-14 ENCOUNTER — Other Ambulatory Visit: Payer: Self-pay

## 2020-02-14 DIAGNOSIS — Z1231 Encounter for screening mammogram for malignant neoplasm of breast: Secondary | ICD-10-CM

## 2020-07-23 DIAGNOSIS — Z1389 Encounter for screening for other disorder: Secondary | ICD-10-CM | POA: Diagnosis not present

## 2020-07-23 DIAGNOSIS — Z13220 Encounter for screening for lipoid disorders: Secondary | ICD-10-CM | POA: Diagnosis not present

## 2020-07-23 DIAGNOSIS — Z131 Encounter for screening for diabetes mellitus: Secondary | ICD-10-CM | POA: Diagnosis not present

## 2020-07-23 DIAGNOSIS — Z0000 Encounter for general adult medical examination without abnormal findings: Secondary | ICD-10-CM | POA: Diagnosis not present

## 2021-01-22 ENCOUNTER — Other Ambulatory Visit: Payer: Self-pay | Admitting: Obstetrics and Gynecology

## 2021-01-22 DIAGNOSIS — Z1231 Encounter for screening mammogram for malignant neoplasm of breast: Secondary | ICD-10-CM

## 2021-02-11 ENCOUNTER — Ambulatory Visit: Payer: BC Managed Care – PPO | Admitting: Obstetrics and Gynecology

## 2021-02-27 ENCOUNTER — Ambulatory Visit: Payer: BC Managed Care – PPO

## 2021-03-31 ENCOUNTER — Other Ambulatory Visit: Payer: Self-pay

## 2021-03-31 ENCOUNTER — Ambulatory Visit
Admission: RE | Admit: 2021-03-31 | Discharge: 2021-03-31 | Disposition: A | Discharge status: Home / Self Care | Payer: BC Managed Care – PPO | Source: Ambulatory Visit | Attending: Obstetrics and Gynecology | Admitting: Obstetrics and Gynecology

## 2021-03-31 DIAGNOSIS — Z1231 Encounter for screening mammogram for malignant neoplasm of breast: Secondary | ICD-10-CM

## 2021-04-09 ENCOUNTER — Ambulatory Visit: Payer: BC Managed Care – PPO | Admitting: Obstetrics and Gynecology

## 2021-04-14 ENCOUNTER — Ambulatory Visit: Payer: BC Managed Care – PPO | Admitting: Obstetrics and Gynecology

## 2021-04-22 NOTE — Progress Notes (Signed)
61 y.o. G67P1102 Married [place] American female here for annual exam.    No GYN concerns.   PCP: Luberta Robertson, MD     Patient's last menstrual period was 08/13/2010.           Sexually active: No.  The current method of family planning is post menopausal status.    Exercising: No.     Smoker:  no  Health Maintenance: Pap:  01-07-17 normal Neg HPV History of abnormal Pap:  no MMG:  03-31-21 normal, BI-RADS 1, cat C density Colonoscopy:  2011 normal.  Needs to reschedule this.  BMD:   never  Result   TDaP:  2013 Gardasil:   no HIV: neg in past Hep C: neg in past Screening Labs:  PCP Flu vaccine:  completed. Covid:  booster completed.   reports that she has never smoked. She has never used smokeless tobacco. She reports that she does not drink alcohol and does not use drugs.  Past Medical History:  Diagnosis Date   Anemia    Family history of BRCA2 gene positive    Family history of breast cancer    Family history of cancer    Family history of prostate cancer    Fibroid     Past Surgical History:  Procedure Laterality Date   TUBAL LIGATION  03/15/1993    No current outpatient medications on file.   No current facility-administered medications for this visit.    Family History  Problem Relation Age of Onset   Cancer Sister        possibly ovarian, unsure   Prostate cancer Brother 43   Cancer Maternal Aunt        possibly ovarian   Cancer Paternal Grandfather        unsure type   Lung cancer Paternal Uncle    Breast cancer Niece 47       "BRCA2+", daugher of Rolan Bucco    Review of Systems  All other systems reviewed and are negative.  Exam:   BP 102/68    Pulse 84    Ht 5' 5.25" (1.657 m)    Wt 183 lb (83 kg)    LMP 08/13/2010    SpO2 99%    BMI 30.22 kg/m     General appearance: alert, cooperative and appears stated age Head: normocephalic, without obvious abnormality, atraumatic Neck: no adenopathy, supple, symmetrical, trachea midline and thyroid  normal to inspection and palpation Lungs: clear to auscultation bilaterally Breasts: normal appearance, no masses or tenderness, No nipple retraction or dimpling, No nipple discharge or bleeding, No axillary adenopathy Heart: regular rate and rhythm Abdomen: soft, non-tender; no masses, no organomegaly Extremities: extremities normal, atraumatic, no cyanosis or edema Skin: skin color, texture, turgor normal. No rashes or lesions Lymph nodes: cervical, supraclavicular, and axillary nodes normal. Neurologic: grossly normal  Pelvic: External genitalia:  no lesions              No abnormal inguinal nodes palpated.              Urethra:  normal appearing urethra with no masses, tenderness or lesions              Bartholins and Skenes: normal                 Vagina: normal appearing vagina with normal color and discharge, no lesions              Cervix: no lesions  Pap taken: yes Bimanual Exam:  Uterus:  normal size, contour, position, consistency, mobility, non-tender              Adnexa: no mass, fullness, tenderness              Rectal exam: yes.  Confirms.              Anus:  normal sphincter tone, no lesions  Chaperone was present for exam:  Lovena Le, CMA  Assessment:   Well woman visit with gynecologic exam. FH ovarian and prostate cancer.  Patient had negative genetic testing.  Uterine fibroids.   Plan: Mammogram screening discussed. Self breast awareness reviewed. Pap and HR HPV collected. Guidelines for Calcium, Vitamin D, regular exercise program including cardiovascular and weight bearing exercise. TDap today. She will consider pelvic US.  Follow up annually and prn.   After visit summary provided.

## 2021-05-01 ENCOUNTER — Encounter: Payer: Self-pay | Admitting: Obstetrics and Gynecology

## 2021-05-01 ENCOUNTER — Ambulatory Visit (INDEPENDENT_AMBULATORY_CARE_PROVIDER_SITE_OTHER): Payer: BC Managed Care – PPO | Admitting: Obstetrics and Gynecology

## 2021-05-01 ENCOUNTER — Other Ambulatory Visit (HOSPITAL_COMMUNITY)
Admission: RE | Admit: 2021-05-01 | Discharge: 2021-05-01 | Disposition: A | Discharge status: Home / Self Care | Payer: BC Managed Care – PPO | Source: Ambulatory Visit | Attending: Obstetrics and Gynecology | Admitting: Obstetrics and Gynecology

## 2021-05-01 ENCOUNTER — Other Ambulatory Visit: Payer: Self-pay

## 2021-05-01 VITALS — BP 102/68 | HR 84 | Ht 65.25 in | Wt 183.0 lb

## 2021-05-01 DIAGNOSIS — Z124 Encounter for screening for malignant neoplasm of cervix: Secondary | ICD-10-CM | POA: Insufficient documentation

## 2021-05-01 DIAGNOSIS — Z01419 Encounter for gynecological examination (general) (routine) without abnormal findings: Secondary | ICD-10-CM

## 2021-05-01 DIAGNOSIS — Z23 Encounter for immunization: Secondary | ICD-10-CM | POA: Diagnosis not present

## 2021-05-01 NOTE — Patient Instructions (Signed)
EXERCISE AND DIET:  We recommended that you start or continue a regular exercise program for good health. Regular exercise means any activity that makes your heart beat faster and makes you sweat.  We recommend exercising at least 30 minutes per day at least 3 days a week, preferably 4 or 5.  We also recommend a diet low in fat and sugar.  Inactivity, poor dietary choices and obesity can cause diabetes, heart attack, stroke, and kidney damage, among others.   ° °ALCOHOL AND SMOKING:  Women should limit their alcohol intake to no more than 7 drinks/beers/glasses of wine (combined, not each!) per week. Moderation of alcohol intake to this level decreases your risk of breast cancer and liver damage. And of course, no recreational drugs are part of a healthy lifestyle.  And absolutely no smoking or even second hand smoke. Most people know smoking can cause heart and lung diseases, but did you know it also contributes to weakening of your bones? Aging of your skin?  Yellowing of your teeth and nails? ° °CALCIUM AND VITAMIN D:  Adequate intake of calcium and Vitamin D are recommended.  The recommendations for exact amounts of these supplements seem to change often, but generally speaking 600 mg of calcium (either carbonate or citrate) and 800 units of Vitamin D per day seems prudent. Certain women may benefit from higher intake of Vitamin D.  If you are among these women, your doctor will have told you during your visit.   ° °PAP SMEARS:  Pap smears, to check for cervical cancer or precancers,  have traditionally been done yearly, although recent scientific advances have shown that most women can have pap smears less often.  However, every woman still should have a physical exam from her gynecologist every year. It will include a breast check, inspection of the vulva and vagina to check for abnormal growths or skin changes, a visual exam of the cervix, and then an exam to evaluate the size and shape of the uterus and  ovaries.  And after 63 years of age, a rectal exam is indicated to check for rectal cancers. We will also provide age appropriate advice regarding health maintenance, like when you should have certain vaccines, screening for sexually transmitted diseases, bone density testing, colonoscopy, mammograms, etc.  ° °MAMMOGRAMS:  All women over 40 years old should have a yearly mammogram. Many facilities now offer a "3D" mammogram, which may cost around $50 extra out of pocket. If possible,  we recommend you accept the option to have the 3D mammogram performed.  It both reduces the number of women who will be called back for extra views which then turn out to be normal, and it is better than the routine mammogram at detecting truly abnormal areas.   ° °COLONOSCOPY:  Colonoscopy to screen for colon cancer is recommended for all women at age 50.  We know, you hate the idea of the prep.  We agree, BUT, having colon cancer and not knowing it is worse!!  Colon cancer so often starts as a polyp that can be seen and removed at colonscopy, which can quite literally save your life!  And if your first colonoscopy is normal and you have no family history of colon cancer, most women don't have to have it again for 10 years.  Once every ten years, you can do something that may end up saving your life, right?  We will be happy to help you get it scheduled when you are ready.    Be sure to check your insurance coverage so you understand how much it will cost.  It may be covered as a preventative service at no cost, but you should check your particular policy.   ° °Calcium Content in Foods °Calcium is the most abundant mineral in the body. Most of the body's calcium supply is stored in bones and teeth. Calcium helps many parts of the body function normally, including: °Blood and blood vessels. °Nerves. °Hormones. °Muscles. °Bones and teeth. °When your calcium stores are low, you may be at risk for low bone mass, bone loss, and broken bones  (fractures). When you get enough calcium, it helps to support strong bones and teeth throughout your life. °Calcium is especially important for: °Children during growth spurts. °Girls during adolescence. °Women who are pregnant or breastfeeding. °Women after their menstrual cycle stops (postmenopause). °Women whose menstrual cycle has stopped due to anorexia nervosa or regular intense exercise. °People who cannot eat or digest dairy products. °Vegans. °Recommended daily amounts of calcium: °Women (ages 19 to 50): 1,000 mg per day. °Women (ages 51 and older): 1,200 mg per day. °Men (ages 19 to 70): 1,000 mg per day. °Men (ages 71 and older): 1,200 mg per day. °Women (ages 9 to 18): 1,300 mg per day. °Men (ages 9 to 18): 1,300 mg per day. °General information °Eat foods that are high in calcium. Try to get most of your calcium from food. °Some people may benefit from taking calcium supplements. Check with your health care provider or diet and nutrition specialist (dietitian) before starting any calcium supplements. Calcium supplements may interact with certain medicines. Too much calcium may cause other health problems, such as constipation and kidney stones. °For the body to absorb calcium, it needs vitamin D. Sources of vitamin D include: °Skin exposure to direct sunlight. °Foods, such as egg yolks, liver, mushrooms, saltwater fish, and fortified milk. °Vitamin D supplements. Check with your health care provider or dietitian before starting any vitamin D supplements. °What foods are high in calcium? °Foods that are high in calcium contain more than 100 milligrams per serving. °Fruits °Fortified orange juice or other fruit juice, 300 mg per 8 oz serving. °Vegetables °Collard greens, 360 mg per 8 oz serving. °Kale, 100 mg per 8 oz serving. °Bok choy, 160 mg per 8 oz serving. °Grains °Fortified ready-to-eat cereals, 100 to 1,000 mg per 8 oz serving. °Fortified frozen waffles, 200 mg in 2 waffles. °Oatmeal, 140 mg in 1  cup. °Meats and other proteins °Sardines, canned with bones, 325 mg per 3 oz serving. °Salmon, canned with bones, 180 mg per 3 oz serving. °Canned shrimp, 125 mg per 3 oz serving. °Baked beans, 160 mg per 4 oz serving. °Tofu, firm, made with calcium sulfate, 253 mg per 4 oz serving. °Dairy °Yogurt, plain, low-fat, 310 mg per 6 oz serving. °Nonfat milk, 300 mg per 8 oz serving. °American cheese, 195 mg per 1 oz serving. °Cheddar cheese, 205 mg per 1 oz serving. °Cottage cheese 2%, 105 mg per 4 oz serving. °Fortified soy, rice, or almond milk, 300 mg per 8 oz serving. °Mozzarella, part skim, 210 mg per 1 oz serving. °The items listed above may not be a complete list of foods high in calcium. Actual amounts of calcium may be different depending on processing. Contact a dietitian for more information. °What foods are lower in calcium? °Foods that are lower in calcium contain 50 mg or less per serving. °Fruits °Apple, about 6 mg. °Banana, about 12 mg. °Vegetables °  Lettuce, 19 mg per 2 oz serving. °Tomato, about 11 mg. °Grains °Rice, 4 mg per 6 oz serving. °Boiled potatoes, 14 mg per 8 oz serving. °White bread, 6 mg per slice. °Meats and other proteins °Egg, 27 mg per 2 oz serving. °Red meat, 7 mg per 4 oz serving. °Chicken, 17 mg per 4 oz serving. °Fish, cod, or trout, 20 mg per 4 oz serving. °Dairy °Cream cheese, regular, 14 mg per 1 Tbsp serving. °Brie cheese, 50 mg per 1 oz serving. °Parmesan cheese, 70 mg per 1 Tbsp serving. °The items listed above may not be a complete list of foods lower in calcium. Actual amounts of calcium may be different depending on processing. Contact a dietitian for more information. °Summary °Calcium is an important mineral in the body because it affects many functions. Getting enough calcium helps support strong bones and teeth throughout your life. °Try to get most of your calcium from food. °Calcium supplements may interact with certain medicines. Check with your health care provider or  dietitian before starting any calcium supplements. °This information is not intended to replace advice given to you by your health care provider. Make sure you discuss any questions you have with your health care provider. °Document Revised: 06/27/2019 Document Reviewed: 06/27/2019 °Elsevier Patient Education © 2022 [employer] ° °

## 2021-05-04 LAB — CYTOLOGY - PAP
Comment: NEGATIVE
Diagnosis: NEGATIVE
High risk HPV: NEGATIVE

## 2022-02-26 ENCOUNTER — Other Ambulatory Visit: Payer: Self-pay | Admitting: Obstetrics and Gynecology

## 2022-02-26 DIAGNOSIS — Z1231 Encounter for screening mammogram for malignant neoplasm of breast: Secondary | ICD-10-CM

## 2022-04-22 NOTE — Progress Notes (Signed)
62 y.o. G63P1102 Married [place] American female here for annual exam.    No GYN concerns.   PCP:   Dr. Netty Starring  Patient's last menstrual period was 07/14/2010.           Sexually active: No.  The current method of family planning is post menopausal status/BTL.    Exercising: No.     Smoker:  no  Health Maintenance: Pap:  05/01/21 neg: HR HPV neg, 01/07/17 neg: HR HPV neg History of abnormal Pap:  no MMG:  03/31/21 Breast Density Category C, BI-RADS CATEGORY 1 Neg.  She will reschedule. Colonoscopy: 2023 per pt, normal - due in 10 years per patient.  BMD:   n/a  Result  n/a TDaP:  05/01/21 Gardasil:   no HIV: neg in the past Hep C: neg in the past Screening Labs:  PCP Flu vaccine:  today.   reports that she has never smoked. She has never used smokeless tobacco. She reports that she does not drink alcohol and does not use drugs.  Past Medical History:  Diagnosis Date   Anemia    Family history of BRCA2 gene positive    Family history of breast cancer    Family history of cancer    Family history of prostate cancer    Fibroid     Past Surgical History:  Procedure Laterality Date   TUBAL LIGATION  03/15/1993    No current outpatient medications on file.   No current facility-administered medications for this visit.    Family History  Problem Relation Age of Onset   Cancer Sister        possibly ovarian, unsure   Prostate cancer Brother 106   Cancer Maternal Aunt        possibly ovarian   Cancer Paternal Grandfather        unsure type   Lung cancer Paternal Uncle    Breast cancer Niece 97       "BRCA2+", daugher of Rolan Bucco    Review of Systems  All other systems reviewed and are negative.   Exam:   BP 128/80 (BP Location: Left Arm, Patient Position: Sitting, Cuff Size: Normal)   Pulse 94   Ht 5' 5.5" (1.664 m)   Wt 181 lb (82.1 kg)   LMP 07/14/2010   SpO2 96%   BMI 29.66 kg/m     General appearance: alert, cooperative and appears stated age Head:  normocephalic, without obvious abnormality, atraumatic Neck: no adenopathy, supple, symmetrical, trachea midline and thyroid normal to inspection and palpation Lungs: clear to auscultation bilaterally Breasts: normal appearance, no masses or tenderness, No nipple retraction or dimpling, No nipple discharge or bleeding, No axillary adenopathy Heart: regular rate and rhythm Abdomen: soft, non-tender; no masses, no organomegaly Extremities: extremities normal, atraumatic, no cyanosis or edema Skin: skin color, texture, turgor normal. No rashes or lesions Lymph nodes: cervical, supraclavicular, and axillary nodes normal. Neurologic: grossly normal  Pelvic: External genitalia:  no lesions              No abnormal inguinal nodes palpated.              Urethra:  normal appearing urethra with no masses, tenderness or lesions              Bartholins and Skenes: normal                 Vagina: normal appearing vagina with normal color and discharge, no lesions  Cervix: no lesions              Pap taken: no Bimanual Exam:  Uterus:  normal size, contour, position, consistency, mobility, non-tender              Adnexa: no mass, fullness, tenderness              Rectal exam: yes.  Confirms.              Anus:  normal sphincter tone, no lesions  Chaperone was present for exam:  Emily  Assessment:   Well woman visit with gynecologic exam. FH ovarian and prostate cancer.   FH BRCA2.  Patient had negative genetic testing.  Uterine fibroids.   Plan: Mammogram screening discussed.  She will schedule. Self breast awareness reviewed. Pap and HR HPV 2028 Guidelines for Calcium, Vitamin D, regular exercise program including cardiovascular and weight bearing exercise. BMD in 1 - 2 years. Follow up annually and prn.   After visit summary provided.

## 2022-04-23 ENCOUNTER — Ambulatory Visit: Payer: BC Managed Care – PPO

## 2022-05-05 ENCOUNTER — Ambulatory Visit
Admission: RE | Admit: 2022-05-05 | Discharge: 2022-05-05 | Disposition: A | Discharge status: Home / Self Care | Payer: BC Managed Care – PPO | Source: Ambulatory Visit | Attending: Obstetrics and Gynecology | Admitting: Obstetrics and Gynecology

## 2022-05-05 ENCOUNTER — Encounter: Payer: Self-pay | Admitting: Obstetrics and Gynecology

## 2022-05-05 ENCOUNTER — Ambulatory Visit (INDEPENDENT_AMBULATORY_CARE_PROVIDER_SITE_OTHER): Payer: BC Managed Care – PPO | Admitting: Obstetrics and Gynecology

## 2022-05-05 VITALS — BP 128/80 | HR 94 | Ht 65.5 in | Wt 181.0 lb

## 2022-05-05 DIAGNOSIS — Z1231 Encounter for screening mammogram for malignant neoplasm of breast: Secondary | ICD-10-CM

## 2022-05-05 DIAGNOSIS — Z01419 Encounter for gynecological examination (general) (routine) without abnormal findings: Secondary | ICD-10-CM

## 2022-05-05 NOTE — Patient Instructions (Signed)

## 2023-03-29 ENCOUNTER — Other Ambulatory Visit: Payer: Self-pay | Admitting: Obstetrics and Gynecology

## 2023-03-29 DIAGNOSIS — Z1231 Encounter for screening mammogram for malignant neoplasm of breast: Secondary | ICD-10-CM

## 2023-04-26 IMAGING — MG MM DIGITAL SCREENING BILAT W/ TOMO AND CAD
8 series · 8 of 24 positions shown · non-contrast
Comparison: Previous exam(s).

CLINICAL DATA: Screening.

EXAM:
DIGITAL SCREENING BILATERAL MAMMOGRAM WITH TOMOSYNTHESIS AND CAD
TECHNIQUE: Bilateral screening digital craniocaudal and mediolateral oblique
mammograms were obtained. Bilateral screening digital breast
tomosynthesis was performed. The images were evaluated with
computer-aided detection.

[L CC synth-2D]
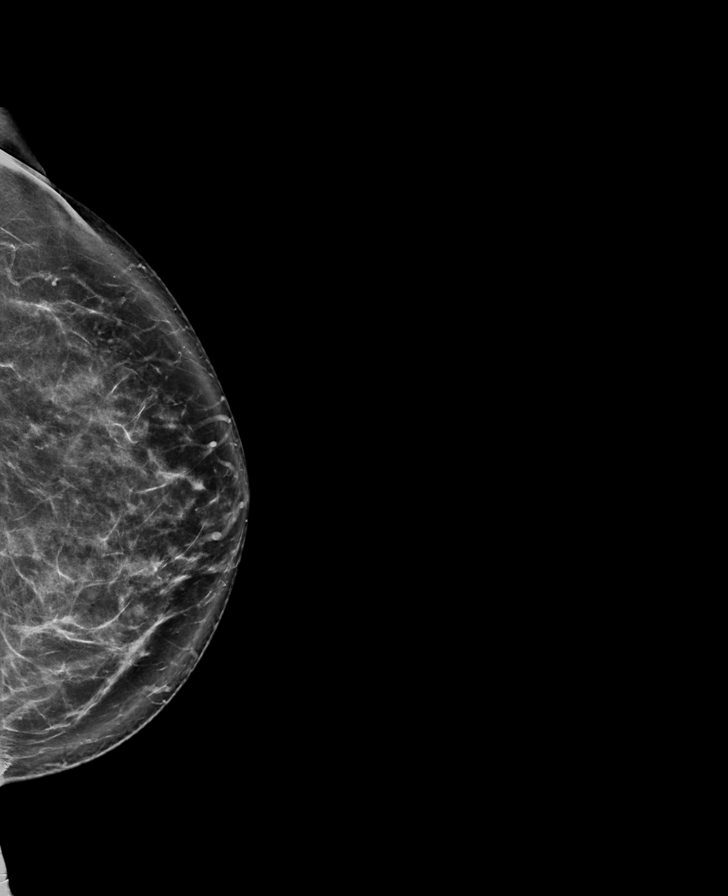

[L MLO synth-2D]
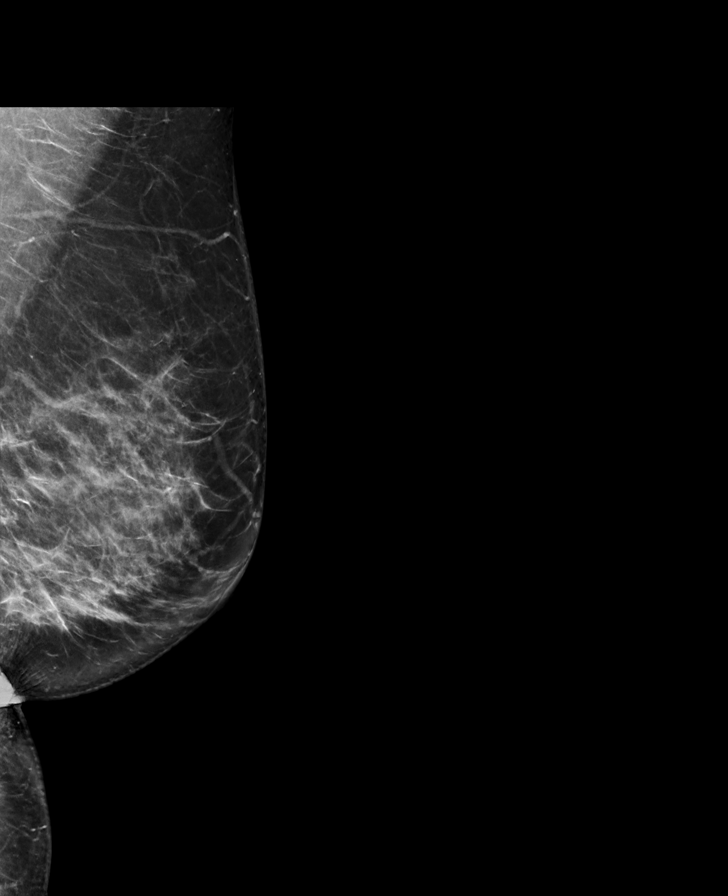

[R MLO synth-2D]
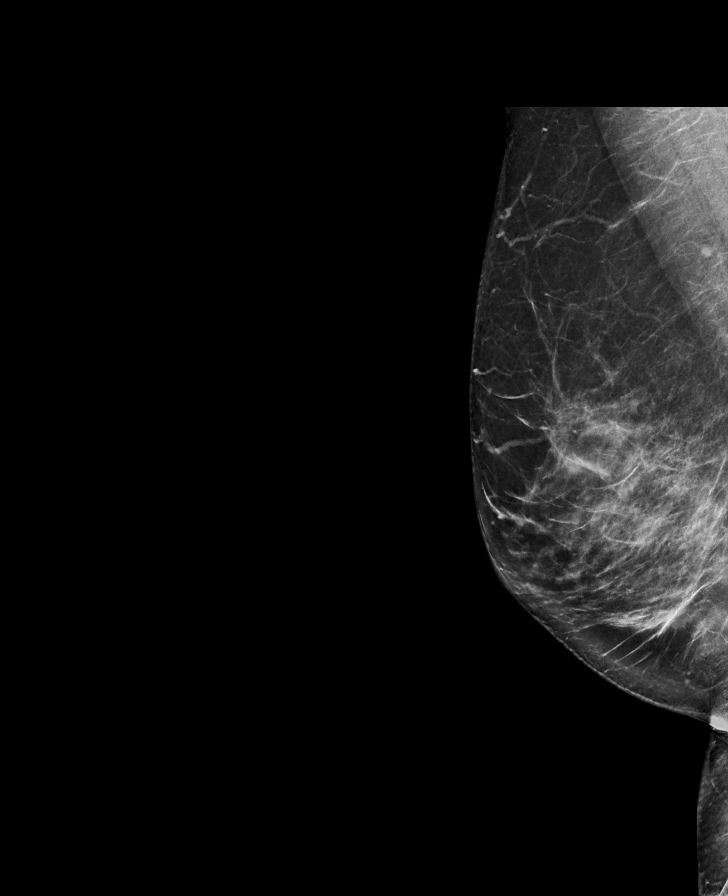

[R CC synth-2D]
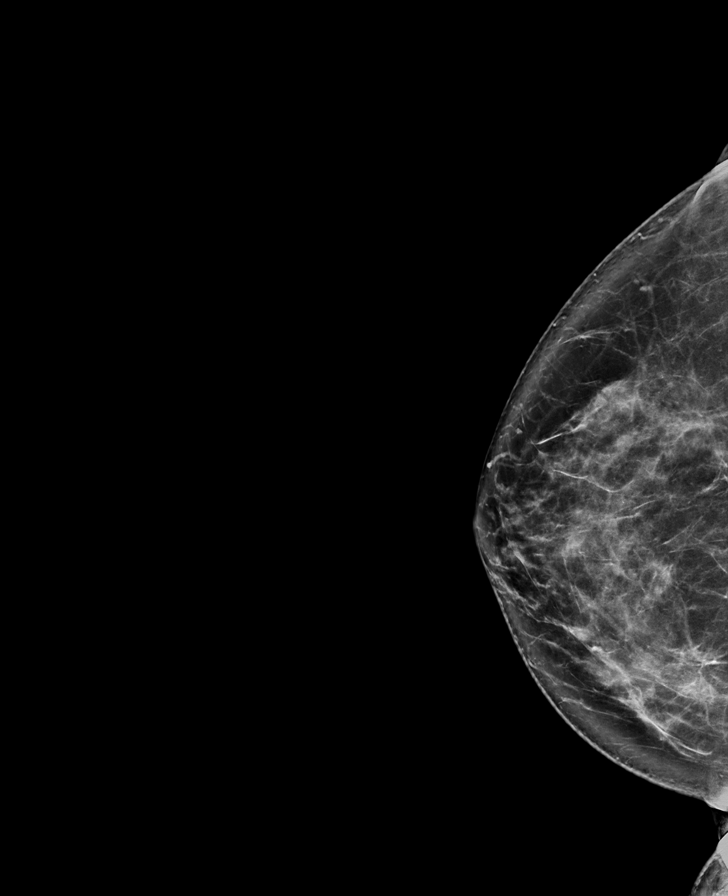

[R CC tomo · tomo slice 45/90.0]
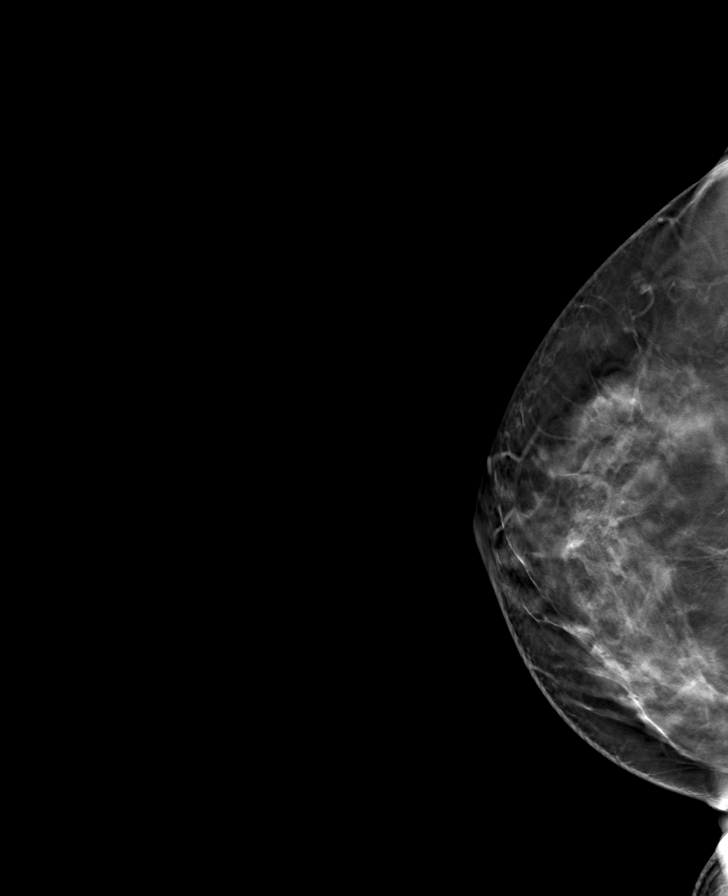

[L CC tomo · tomo slice 49/96.0]
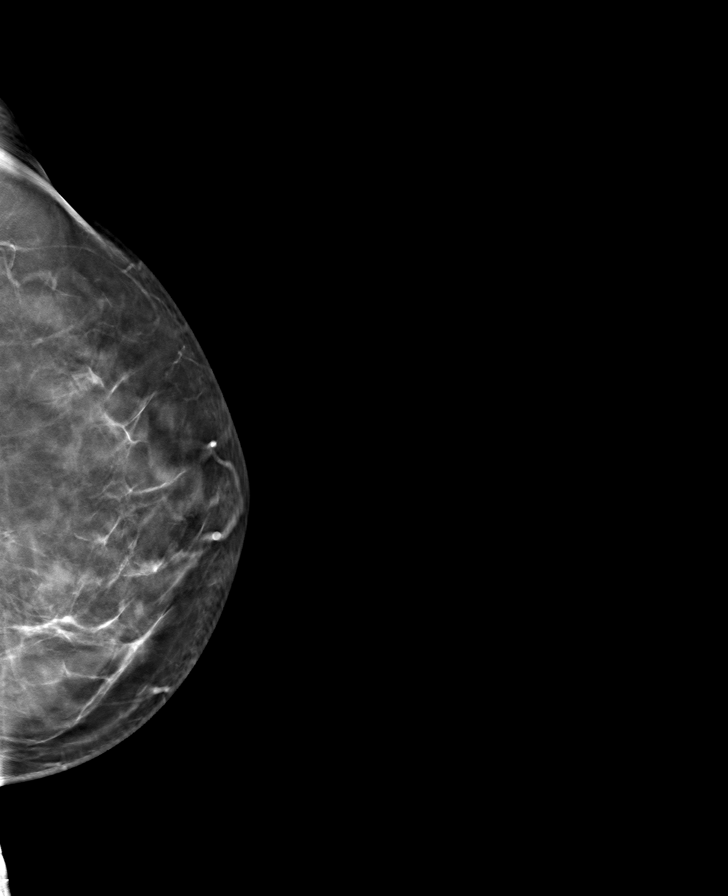

[L MLO tomo · tomo slice 45/89.0]
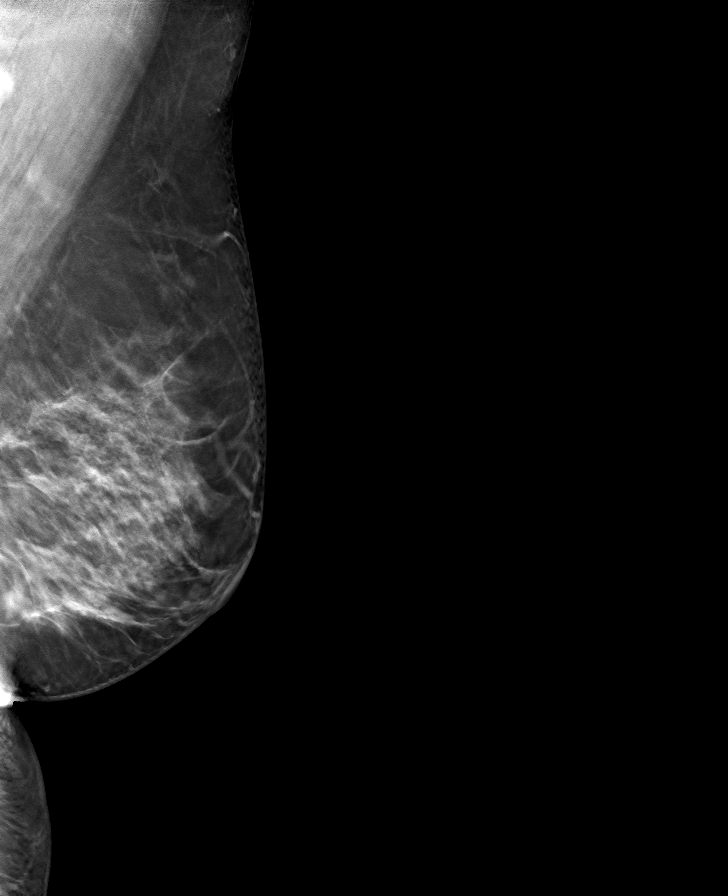

[R MLO tomo · tomo slice 45/88.0]
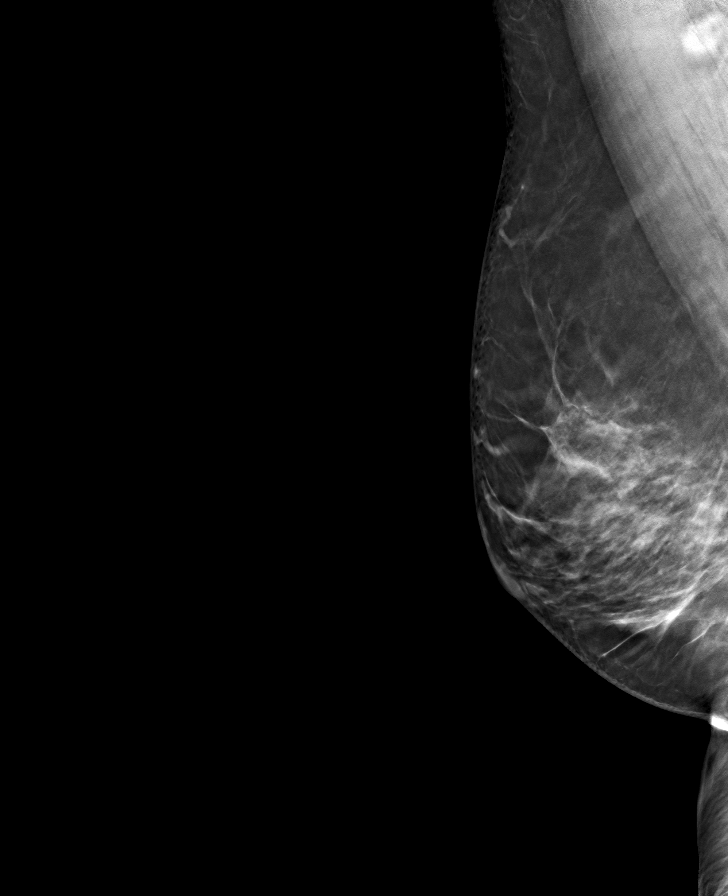

[8 of 24 positions shown; findings below may reference images not displayed]

ACR Breast Density Category c: The breast tissue is heterogeneously
dense, which may obscure small masses.
FINDINGS: There are no findings suspicious for malignancy.
IMPRESSION: No mammographic evidence of malignancy. A result letter of this
screening mammogram will be mailed directly to the patient.

RECOMMENDATION:
Screening mammogram in one year. (Code:[id number])

BI-RADS CATEGORY  1: Negative.

## 2023-05-12 ENCOUNTER — Ambulatory Visit
Admission: RE | Admit: 2023-05-12 | Discharge: 2023-05-12 | Disposition: A | Discharge status: Home / Self Care | Payer: BC Managed Care – PPO | Source: Ambulatory Visit | Attending: Obstetrics and Gynecology | Admitting: Obstetrics and Gynecology

## 2023-05-12 DIAGNOSIS — Z1231 Encounter for screening mammogram for malignant neoplasm of breast: Secondary | ICD-10-CM

## 2023-05-16 ENCOUNTER — Encounter: Payer: Self-pay | Admitting: Obstetrics and Gynecology

## 2024-03-23 NOTE — Progress Notes (Incomplete)
 "  64 y.o. [id number] postmenopausal female here for annual exam. Married. PCP: Alla Amis, MD   She reports ***. Urine sample provided: ***  Postmenopausal bleeding: *** Pelvic discharge or pain: *** Breast mass, nipple discharge or skin changes : *** Sexually active: ***   Last PAP:     Component Value Date/Time   DIAGPAP  05/01/2021 1241    - Negative for intraepithelial lesion or malignancy (NILM)   DIAGPAP  01/07/2017 0000    NEGATIVE FOR INTRAEPITHELIAL LESIONS OR MALIGNANCY.   HPVHIGH Negative 05/01/2021 1241   ADEQPAP  05/01/2021 1241    Satisfactory for evaluation; transformation zone component PRESENT.   ADEQPAP  01/07/2017 0000    Satisfactory for evaluation. The presence or absence of an endocervical / transformation zone component cannot be determined because of atrophy.   Last mammogram: 05/12/2023 Birads 1, Density C Last DXA: *** *** Last colonoscopy: *** ***  Exercising: *** Smoker:***       GYN HISTORY: ***  OB History  Gravida Para Term Preterm AB Living  2 2 1 1  2   SAB IAB Ectopic Multiple Live Births      2    # Outcome Date GA Lbr Len/2nd Weight Sex Type Anes PTL Lv  2 Term 1985 [redacted]w[redacted]d  6 lb 7 oz (2.92 kg) F Vag-Spont   LIV  1 Preterm 1981   5 lb (2.268 kg) M Vag-Spont   LIV   Past Medical History:  Diagnosis Date   Anemia    Family history of BRCA2 gene positive    Family history of breast cancer    Family history of cancer    Family history of prostate cancer    Fibroid    Past Surgical History:  Procedure Laterality Date   TUBAL LIGATION  03/15/1993   Medications Ordered Prior to Encounter[1] Social History   Socioeconomic History   Marital status: Married    Spouse name: Not on file   Number of children: Not on file   Years of education: Not on file   Highest education level: Not on file  Occupational History   Not on file  Tobacco Use   Smoking status: Never   Smokeless tobacco: Never  Vaping Use   Vaping status:  Never Used  Substance and Sexual Activity   Alcohol use: No    Alcohol/week: 0.0 standard drinks of alcohol    Comment: drinks 1/2 glass of wine twice a month   Drug use: No   Sexual activity: Not Currently    Birth control/protection: Surgical    Comment: BTL  Other Topics Concern   Not on file  Social History Narrative   Not on file   Social Drivers of Health   Tobacco Use: Low Risk  (02/21/2024)   Received from [facility]   Patient History    Smoking Tobacco Use: Never    Smokeless Tobacco Use: Never    Passive Exposure: Past  Financial Resource Strain: Low Risk  (08/16/2023)   Received from [facility]   Overall Financial Resource Strain (CARDIA)    Difficulty of Paying Living Expenses: Not hard at all  Food Insecurity: No Food Insecurity (08/16/2023)   Received from [facility]   Epic    Within the past 12 months, you worried that your food would run out before you got the money to buy more.: Never true    Within the past 12 months, the food you bought just  didn't last and you didn't have money to get more.: Never true  Transportation Needs: No Transportation Needs (08/16/2023)   Received from [facility] - Transportation    In the past 12 months, has lack of transportation kept you from medical appointments or from getting medications?: No    Lack of Transportation (Non-Medical): No  Physical Activity: Not on file  Stress: Not on file  Social Connections: Not on file  Intimate Partner Violence: Not on file  Depression (EYV7-0): Not on file  Alcohol Screen: Not on file  Housing: Low Risk  (08/16/2023)   Received from [facility]   Epic    In the last 12 months, was there a time when you were not able to pay the mortgage or rent on time?: No    In the past 12 months, how many times have you moved where you were living?: 0    At any time in the past 12 months, were you  homeless or living in a shelter (including now)?: No  Utilities: Not At Risk (08/16/2023)   Received from [facility] Utilities    Threatened with loss of utilities: No  Health Literacy: Not on file   Family History  Problem Relation Age of Onset   Cancer Sister        possibly ovarian, unsure   Prostate cancer Brother 81   Cancer Maternal Aunt        possibly ovarian   Cancer Paternal Grandfather        unsure type   Lung cancer Paternal Uncle    Breast cancer Niece 2       BRCA2+, daugher of Julius   Allergies[2]    PE There were no vitals filed for this visit. There is no height or weight on file to calculate BMI.  Physical Exam    Assessment and Plan:        There are no diagnoses linked to this encounter. Clotilda FORBES Pa, CMA      [1]  No current outpatient medications on file prior to visit.   No current facility-administered medications on file prior to visit.  [2]  Allergies Allergen Reactions   Meloxicam Nausea Only   Naprosyn [Naproxen] Other (See Comments)    Heart Burn   "

## 2024-03-26 ENCOUNTER — Ambulatory Visit: Admitting: Obstetrics and Gynecology

## 2024-04-06 NOTE — Progress Notes (Incomplete)
 "  64 y.o. [id number] postmenopausal female here for annual exam. Married. PCP: Alla Amis, MD   She reports ***. Urine sample provided: ***  Postmenopausal bleeding: *** Pelvic discharge or pain: *** Breast mass, nipple discharge or skin changes : *** Sexually active: No   Last PAP:     Component Value Date/Time   DIAGPAP  05/01/2021 1241    - Negative for intraepithelial lesion or malignancy (NILM)   DIAGPAP  01/07/2017 0000    NEGATIVE FOR INTRAEPITHELIAL LESIONS OR MALIGNANCY.   HPVHIGH Negative 05/01/2021 1241   ADEQPAP  05/01/2021 1241    Satisfactory for evaluation; transformation zone component PRESENT.   ADEQPAP  01/07/2017 0000    Satisfactory for evaluation. The presence or absence of an endocervical / transformation zone component cannot be determined because of atrophy.   Last mammogram: 05/12/23 Birads 1, Density C Last DXA: n/a n/a Last colonoscopy: 11/06/21 q10y  Exercising: No Smoker: No       GYN HISTORY: ***  OB History  Gravida Para Term Preterm AB Living  2 2 1 1  2   SAB IAB Ectopic Multiple Live Births      2    # Outcome Date GA Lbr Len/2nd Weight Sex Type Anes PTL Lv  2 Term 1985 [redacted]w[redacted]d  6 lb 7 oz (2.92 kg) F Vag-Spont   LIV  1 Preterm 1981   5 lb (2.268 kg) M Vag-Spont   LIV   Past Medical History:  Diagnosis Date   Anemia    Family history of BRCA2 gene positive    Family history of breast cancer    Family history of cancer    Family history of prostate cancer    Fibroid    Past Surgical History:  Procedure Laterality Date   TUBAL LIGATION  03/15/1993   Medications Ordered Prior to Encounter[1] Social History   Socioeconomic History   Marital status: Married    Spouse name: Not on file   Number of children: Not on file   Years of education: Not on file   Highest education level: Not on file  Occupational History   Not on file  Tobacco Use   Smoking status: Never   Smokeless tobacco: Never  Vaping Use   Vaping  status: Never Used  Substance and Sexual Activity   Alcohol use: No    Alcohol/week: 0.0 standard drinks of alcohol    Comment: drinks 1/2 glass of wine twice a month   Drug use: No   Sexual activity: Not Currently    Birth control/protection: Surgical    Comment: BTL  Other Topics Concern   Not on file  Social History Narrative   Not on file   Social Drivers of Health   Tobacco Use: Low Risk  (02/21/2024)   Received from [facility]   Patient History    Smoking Tobacco Use: Never    Smokeless Tobacco Use: Never    Passive Exposure: Past  Financial Resource Strain: Low Risk  (08/16/2023)   Received from [facility]   Overall Financial Resource Strain (CARDIA)    Difficulty of Paying Living Expenses: Not hard at all  Food Insecurity: No Food Insecurity (08/16/2023)   Received from [facility]   Epic    Within the past 12 months, you worried that your food would run out before you got the money to buy more.: Never true    Within the past 12 months, the food you bought  just didn't last and you didn't have money to get more.: Never true  Transportation Needs: No Transportation Needs (08/16/2023)   Received from [facility] - Transportation    In the past 12 months, has lack of transportation kept you from medical appointments or from getting medications?: No    Lack of Transportation (Non-Medical): No  Physical Activity: Not on file  Stress: Not on file  Social Connections: Not on file  Intimate Partner Violence: Not on file  Depression (EYV7-0): Not on file  Alcohol Screen: Not on file  Housing: Low Risk  (08/16/2023)   Received from [facility]   Epic    In the last 12 months, was there a time when you were not able to pay the mortgage or rent on time?: No    In the past 12 months, how many times have you moved where you were living?: 0    At any time in the past 12 months, were you  homeless or living in a shelter (including now)?: No  Utilities: Not At Risk (08/16/2023)   Received from [facility] Utilities    Threatened with loss of utilities: No  Health Literacy: Not on file   Family History  Problem Relation Age of Onset   Cancer Sister        possibly ovarian, unsure   Prostate cancer Brother 56   Cancer Maternal Aunt        possibly ovarian   Cancer Paternal Grandfather        unsure type   Lung cancer Paternal Uncle    Breast cancer Niece 48       BRCA2+, daugher of Julius   Allergies[2]    PE There were no vitals filed for this visit. There is no height or weight on file to calculate BMI.  Physical Exam    Assessment and Plan:        There are no diagnoses linked to this encounter. Clotilda FORBES Pa, CMA      [1]  No current outpatient medications on file prior to visit.   No current facility-administered medications on file prior to visit.  [2]  Allergies Allergen Reactions   Meloxicam Nausea Only   Naprosyn [Naproxen] Other (See Comments)    Heart Burn   "

## 2024-04-10 ENCOUNTER — Ambulatory Visit: Admitting: Obstetrics and Gynecology

## 2024-04-10 NOTE — Progress Notes (Signed)
 "  64 y.o. [id number] postmenopausal female with FH ovarian and prostate cancer and BRCA2 (patient had negative genetic testing 12/20) here for annual exam. Married. Works in [employer] for [employer]. PCP: Alla Amis, MD   She reports no concerns today. Urine sample provided: No  Postmenopausal bleeding: none Pelvic discharge or pain: none Breast mass, nipple discharge or skin changes : none Sexually active: No   Last PAP:     Component Value Date/Time   DIAGPAP  05/01/2021 1241    - Negative for intraepithelial lesion or malignancy (NILM)   DIAGPAP  01/07/2017 0000    NEGATIVE FOR INTRAEPITHELIAL LESIONS OR MALIGNANCY.   HPVHIGH Negative 05/01/2021 1241   ADEQPAP  05/01/2021 1241    Satisfactory for evaluation; transformation zone component PRESENT.   ADEQPAP  01/07/2017 0000    Satisfactory for evaluation. The presence or absence of an endocervical / transformation zone component cannot be determined because of atrophy.   Last mammogram: 05/12/23 Birads 1, density C, scheduled today Last DXA: never Last colonoscopy: 2023 per pt  q10y  Exercising: No Smoker: No  Flowsheet Row Office Visit from [facility]/19/2024 in [facility]  PHQ-2 Total Score 0       GYN HISTORY: BTL  OB History  Gravida Para Term Preterm AB Living  2 2 1 1  2   SAB IAB Ectopic Multiple Live Births      2    # Outcome Date GA Lbr Len/2nd Weight Sex Type Anes PTL Lv  2 Term 1985 [redacted]w[redacted]d  6 lb 7 oz (2.92 kg) F Vag-Spont   LIV  1 Preterm 1981   5 lb (2.268 kg) M Vag-Spont   LIV   Past Medical History:  Diagnosis Date   Anemia    Family history of BRCA2 gene positive    Family history of breast cancer    Family history of cancer    Family history of prostate cancer    Fibroid    Past Surgical History:  Procedure Laterality Date   TUBAL LIGATION  03/15/1993   Medications Ordered Prior to Encounter[1] Social History   Socioeconomic History   Marital status:  Married    Spouse name: Not on file   Number of children: Not on file   Years of education: Not on file   Highest education level: Not on file  Occupational History   Not on file  Tobacco Use   Smoking status: Never   Smokeless tobacco: Never  Vaping Use   Vaping status: Never Used  Substance and Sexual Activity   Alcohol use: No    Alcohol/week: 0.0 standard drinks of alcohol    Comment: drinks 1/2 glass of wine twice a month   Drug use: No   Sexual activity: Not Currently    Birth control/protection: Surgical    Comment: BTL  Other Topics Concern   Not on file  Social History Narrative   Not on file   Social Drivers of Health   Tobacco Use: Low Risk (04/19/2024)   Patient History    Smoking Tobacco Use: Never    Smokeless Tobacco Use: Never    Passive Exposure: Not on file  Financial Resource Strain: Low Risk  (08/16/2023)   Received from [facility]   Overall Financial Resource Strain (CARDIA)    Difficulty of Paying Living Expenses: Not hard at all  Food Insecurity: No Food Insecurity (08/16/2023)   Received from [facility]   Epic  Within the past 12 months, you worried that your food would run out before you got the money to buy more.: Never true    Within the past 12 months, the food you bought just didn't last and you didn't have money to get more.: Never true  Transportation Needs: No Transportation Needs (08/16/2023)   Received from [facility] - Transportation    In the past 12 months, has lack of transportation kept you from medical appointments or from getting medications?: No    Lack of Transportation (Non-Medical): No  Physical Activity: Not on file  Stress: Not on file  Social Connections: Not on file  Intimate Partner Violence: Not on file  Depression (PHQ2-9): Low Risk (04/19/2024)   Depression (PHQ2-9)    PHQ-2 Score: 0  Alcohol Screen: Not on file  Housing: Low Risk  (08/16/2023)    Received from [facility]   Epic    In the last 12 months, was there a time when you were not able to pay the mortgage or rent on time?: No    In the past 12 months, how many times have you moved where you were living?: 0    At any time in the past 12 months, were you homeless or living in a shelter (including now)?: No  Utilities: Not At Risk (08/16/2023)   Received from [facility] Utilities    Threatened with loss of utilities: No  Health Literacy: Not on file   Family History  Problem Relation Age of Onset   Cancer Sister        possibly ovarian, unsure   Prostate cancer Brother 21   Cancer Maternal Aunt        possibly ovarian   Lung cancer Paternal Uncle    Cancer Paternal Grandfather        unsure type   Breast cancer Niece 34       BRCA2+, daugher of Julius   Allergies[2]    PE Today's Vitals   04/19/24 1207  BP: 118/62  Pulse: 78  Temp: 97.8 F (36.6 C)  TempSrc: Oral  SpO2: 99%  Weight: 182 lb (82.6 kg)  Height: 5' 6 (1.676 m)   Body mass index is 29.38 kg/m.  Physical Exam Vitals reviewed. Exam conducted with a chaperone present.  Constitutional:      General: She is not in acute distress.    Appearance: Normal appearance.  HENT:     Head: Normocephalic and atraumatic.     Nose: Nose normal.  Eyes:     Extraocular Movements: Extraocular movements intact.     Conjunctiva/sclera: Conjunctivae normal.  Pulmonary:     Effort: Pulmonary effort is normal.  Chest:     Chest wall: No mass or tenderness.  Breasts:    Right: Normal. No swelling, mass, nipple discharge, skin change or tenderness.     Left: Normal. No swelling, mass, nipple discharge, skin change or tenderness.  Abdominal:     General: There is no distension.     Palpations: Abdomen is soft.     Tenderness: There is no abdominal tenderness.  Genitourinary:    General: Normal vulva.     Exam position: Lithotomy position.     Urethra: No  prolapse.     Vagina: Normal. No vaginal discharge or bleeding.     Cervix: No cervical motion tenderness.     Uterus: Normal. Not enlarged and not tender.  Adnexa: Right adnexa normal and left adnexa normal.  Musculoskeletal:        General: Normal range of motion.     Cervical back: Normal range of motion.  Lymphadenopathy:     Upper Body:     Right upper body: No axillary adenopathy.     Left upper body: No axillary adenopathy.     Lower Body: No right inguinal adenopathy. No left inguinal adenopathy.  Skin:    General: Skin is warm and dry.  Neurological:     General: No focal deficit present.     Mental Status: She is alert.  Psychiatric:        Mood and Affect: Mood normal.        Behavior: Behavior normal.      Assessment and Plan:        Well woman exam with routine gynecological exam Assessment & Plan: Cervical cancer screening performed according to ASCCP guidelines. Encouraged annual mammogram screening Colonoscopy UTD DXA due, ordered Labs and immunizations with her primary Encouraged safe sexual practices as indicated Encouraged healthy lifestyle practices with diet and exercise For patients under 50-70yo, I recommend 1200mg  calcium daily and 600IU of vitamin D daily.    Screening for osteoporosis -     DG Bone Density; Future  Negative depression screening   Vera LULLA Pa, MD      [1]  Current Outpatient Medications on File Prior to Visit  Medication Sig Dispense Refill   cyanocobalamin (VITAMIN B12) 1000 MCG tablet Take 1,000 mcg by mouth.     No current facility-administered medications on file prior to visit.  [2]  Allergies Allergen Reactions   Meloxicam Nausea Only   Naprosyn [Naproxen] Other (See Comments)    Heart Burn   "

## 2024-04-12 ENCOUNTER — Other Ambulatory Visit: Payer: Self-pay | Admitting: Obstetrics and Gynecology

## 2024-04-12 DIAGNOSIS — Z1231 Encounter for screening mammogram for malignant neoplasm of breast: Secondary | ICD-10-CM

## 2024-04-18 ENCOUNTER — Ambulatory Visit: Admitting: Obstetrics and Gynecology

## 2024-04-19 ENCOUNTER — Encounter: Payer: Self-pay | Admitting: Obstetrics and Gynecology

## 2024-04-19 ENCOUNTER — Ambulatory Visit
Admission: RE | Admit: 2024-04-19 | Discharge: 2024-04-19 | Disposition: A | Source: Ambulatory Visit | Attending: Obstetrics and Gynecology | Admitting: Obstetrics and Gynecology

## 2024-04-19 ENCOUNTER — Ambulatory Visit: Admitting: Obstetrics and Gynecology

## 2024-04-19 VITALS — BP 118/62 | HR 78 | Temp 97.8°F | Ht 66.0 in | Wt 182.0 lb

## 2024-04-19 DIAGNOSIS — Z1331 Encounter for screening for depression: Secondary | ICD-10-CM

## 2024-04-19 DIAGNOSIS — Z01419 Encounter for gynecological examination (general) (routine) without abnormal findings: Secondary | ICD-10-CM | POA: Diagnosis not present

## 2024-04-19 DIAGNOSIS — Z1231 Encounter for screening mammogram for malignant neoplasm of breast: Secondary | ICD-10-CM

## 2024-04-19 DIAGNOSIS — Z13820 Encounter for screening for osteoporosis: Secondary | ICD-10-CM

## 2024-04-19 NOTE — Assessment & Plan Note (Signed)
 Cervical cancer screening performed according to ASCCP guidelines. Encouraged annual mammogram screening Colonoscopy UTD DXA due, ordered Labs and immunizations with her primary Encouraged safe sexual practices as indicated Encouraged healthy lifestyle practices with diet and exercise For patients under 50-66yo, I recommend 1200mg  calcium daily and 600IU of vitamin D  daily.

## 2024-04-19 NOTE — Patient Instructions (Signed)
 For patients under 50-66yo, I recommend 1200mg  calcium  daily and 600IU of vitamin D daily. For patients over 66yo, I recommend 1200mg  calcium  daily and 800IU of vitamin D daily.  Health Maintenance, Female Adopting a healthy lifestyle and getting preventive care are important in promoting health and wellness. Ask your health care provider about: The right schedule for you to have regular tests and exams. Things you can do on your own to prevent diseases and keep yourself healthy. What should I know about diet, weight, and exercise? Eat a healthy diet  Eat a diet that includes plenty of vegetables, fruits, low-fat dairy products, and lean protein. Do not eat a lot of foods that are high in solid fats, added sugars, or sodium. Maintain a healthy weight Body mass index (BMI) is used to identify weight problems. It estimates body fat based on height and weight. Your health care provider can help determine your BMI and help you achieve or maintain a healthy weight. Get regular exercise Get regular exercise. This is one of the most important things you can do for your health. Most adults should: Exercise for at least 150 minutes each week. The exercise should increase your heart rate and make you sweat (moderate-intensity exercise). Do strengthening exercises at least twice a week. This is in addition to the moderate-intensity exercise. Spend less time sitting. Even light physical activity can be beneficial. Watch cholesterol and blood lipids Have your blood tested for lipids and cholesterol at 66 years of age, then have this test every 5 years. Have your cholesterol levels checked more often if: Your lipid or cholesterol levels are high. You are older than 65 years of age. You are at high risk for heart disease. What should I know about cancer screening? Depending on your health history and family history, you may need to have cancer screening at various ages. This may include screening  for: Breast cancer. Cervical cancer. Colorectal cancer. Skin cancer. Lung cancer. What should I know about heart disease, diabetes, and high blood pressure? Blood pressure and heart disease High blood pressure causes heart disease and increases the risk of stroke. This is more likely to develop in people who have high blood pressure readings or are overweight. Have your blood pressure checked: Every 3-5 years if you are 25-57 years of age. Every year if you are 24 years old or older. Diabetes Have regular diabetes screenings. This checks your fasting blood sugar level. Have the screening done: Once every three years after age 62 if you are at a normal weight and have a low risk for diabetes. More often and at a younger age if you are overweight or have a high risk for diabetes. What should I know about preventing infection? Hepatitis B If you have a higher risk for hepatitis B, you should be screened for this virus. Talk with your health care provider to find out if you are at risk for hepatitis B infection. Hepatitis C Testing is recommended for: Everyone born from [birth year] through 1967. Anyone with known risk factors for hepatitis C. Sexually transmitted infections (STIs) Get screened for STIs, including gonorrhea and chlamydia, if: You are sexually active and are younger than 66 years of age. You are older than 66 years of age and your health care provider tells you that you are at risk for this type of infection. Your sexual activity has changed since you were last screened, and you are at increased risk for chlamydia or gonorrhea. Ask your health care provider if  you are at risk. Ask your health care provider about whether you are at high risk for HIV. Your health care provider may recommend a prescription medicine to help prevent HIV infection. If you choose to take medicine to prevent HIV, you should first get tested for HIV. You should then be tested every 3 months for as long as you  are taking the medicine. Osteoporosis and menopause Osteoporosis is a disease in which the bones lose minerals and strength with aging. This can result in bone fractures. If you are 72 years old or older, or if you are at risk for osteoporosis and fractures, ask your health care provider if you should: Be screened for bone loss. Take a calcium  or vitamin D supplement to lower your risk of fractures. Be given hormone replacement therapy (HRT) to treat symptoms of menopause. Follow these instructions at home: Alcohol use Do not drink alcohol if: Your health care provider tells you not to drink. You are pregnant, may be pregnant, or are planning to become pregnant. If you drink alcohol: Limit how much you have to: 0-1 drink a day. Know how much alcohol is in your drink. In the [place] one drink equals one 12 oz bottle of beer (355 mL), one 5 oz glass of wine (148 mL), or one 1 oz glass of hard liquor (44 mL). Lifestyle Do not use any products that contain nicotine or tobacco. These products include cigarettes, chewing tobacco, and vaping devices, such as e-cigarettes. If you need help quitting, ask your health care provider. Do not use street drugs. Do not share needles. Ask your health care provider for help if you need support or information about quitting drugs. General instructions Schedule regular health, dental, and eye exams. Stay current with your vaccines. Tell your health care provider if: You often feel depressed. You have ever been abused or do not feel safe at home. Summary Adopting a healthy lifestyle and getting preventive care are important in promoting health and wellness. Follow your health care provider's instructions about healthy diet, exercising, and getting tested or screened for diseases. Follow your health care provider's instructions on monitoring your cholesterol and blood pressure. This information is not intended to replace advice given to you by your health  care provider. Make sure you discuss any questions you have with your health care provider. Document Revised: 07/21/2020 Document Reviewed: 07/21/2020 [employer] Patient Education  2024 [employer].

## 2024-05-21 ENCOUNTER — Other Ambulatory Visit
